# Patient Record
Sex: Female | Born: 1935
Health system: Southern US, Community
[De-identification: ages and names within clinical notes are randomized; demographics above are authoritative.]

## PROBLEM LIST (undated history)

## (undated) DIAGNOSIS — I639 Cerebral infarction, unspecified: Secondary | ICD-10-CM

## (undated) DIAGNOSIS — J449 Chronic obstructive pulmonary disease, unspecified: Secondary | ICD-10-CM

## (undated) DIAGNOSIS — D649 Anemia, unspecified: Secondary | ICD-10-CM

## (undated) DIAGNOSIS — I10 Essential (primary) hypertension: Secondary | ICD-10-CM

## (undated) DIAGNOSIS — R7301 Impaired fasting glucose: Secondary | ICD-10-CM

## (undated) DIAGNOSIS — M1990 Unspecified osteoarthritis, unspecified site: Secondary | ICD-10-CM

## (undated) DIAGNOSIS — H9190 Unspecified hearing loss, unspecified ear: Secondary | ICD-10-CM

## (undated) DIAGNOSIS — M419 Scoliosis, unspecified: Secondary | ICD-10-CM

## (undated) DIAGNOSIS — R002 Palpitations: Secondary | ICD-10-CM

## (undated) DIAGNOSIS — F0390 Unspecified dementia without behavioral disturbance: Secondary | ICD-10-CM

## (undated) DIAGNOSIS — M810 Age-related osteoporosis without current pathological fracture: Secondary | ICD-10-CM

## (undated) DIAGNOSIS — K219 Gastro-esophageal reflux disease without esophagitis: Secondary | ICD-10-CM

## (undated) HISTORY — DX: Anemia, unspecified: D64.9

## (undated) HISTORY — DX: Gastro-esophageal reflux disease without esophagitis: K21.9

## (undated) HISTORY — DX: Unspecified osteoarthritis, unspecified site: M19.90

## (undated) HISTORY — DX: Scoliosis, unspecified: M41.9

## (undated) HISTORY — DX: Unspecified hearing loss, unspecified ear: H91.90

## (undated) HISTORY — DX: Cerebral infarction, unspecified: I63.9

## (undated) HISTORY — PX: HIP SURGERY: SHX245

## (undated) HISTORY — DX: Age-related osteoporosis without current pathological fracture: M81.0

## (undated) HISTORY — DX: Impaired fasting glucose: R73.01

## (undated) HISTORY — DX: Palpitations: R00.2

## (undated) HISTORY — DX: Chronic obstructive pulmonary disease, unspecified: J44.9

---

## 2007-03-21 ENCOUNTER — Other Ambulatory Visit: Payer: Self-pay

## 2007-03-21 ENCOUNTER — Emergency Department: Payer: Self-pay | Admitting: Emergency Medicine

## 2013-08-31 DIAGNOSIS — R7309 Other abnormal glucose: Secondary | ICD-10-CM | POA: Diagnosis not present

## 2013-08-31 DIAGNOSIS — D649 Anemia, unspecified: Secondary | ICD-10-CM | POA: Diagnosis not present

## 2013-08-31 DIAGNOSIS — Z8673 Personal history of transient ischemic attack (TIA), and cerebral infarction without residual deficits: Secondary | ICD-10-CM | POA: Diagnosis not present

## 2013-08-31 DIAGNOSIS — R05 Cough: Secondary | ICD-10-CM | POA: Diagnosis not present

## 2013-08-31 DIAGNOSIS — K219 Gastro-esophageal reflux disease without esophagitis: Secondary | ICD-10-CM | POA: Diagnosis not present

## 2013-08-31 DIAGNOSIS — I10 Essential (primary) hypertension: Secondary | ICD-10-CM | POA: Diagnosis not present

## 2013-08-31 DIAGNOSIS — J438 Other emphysema: Secondary | ICD-10-CM | POA: Diagnosis not present

## 2013-08-31 DIAGNOSIS — F509 Eating disorder, unspecified: Secondary | ICD-10-CM | POA: Diagnosis not present

## 2013-08-31 DIAGNOSIS — R634 Abnormal weight loss: Secondary | ICD-10-CM | POA: Diagnosis not present

## 2013-08-31 DIAGNOSIS — R059 Cough, unspecified: Secondary | ICD-10-CM | POA: Diagnosis not present

## 2013-08-31 DIAGNOSIS — Z1331 Encounter for screening for depression: Secondary | ICD-10-CM | POA: Diagnosis not present

## 2013-08-31 DIAGNOSIS — E781 Pure hyperglyceridemia: Secondary | ICD-10-CM | POA: Diagnosis not present

## 2013-09-28 DIAGNOSIS — D649 Anemia, unspecified: Secondary | ICD-10-CM | POA: Diagnosis not present

## 2013-09-28 DIAGNOSIS — R634 Abnormal weight loss: Secondary | ICD-10-CM | POA: Diagnosis not present

## 2013-09-28 DIAGNOSIS — R630 Anorexia: Secondary | ICD-10-CM | POA: Diagnosis not present

## 2013-09-28 DIAGNOSIS — K921 Melena: Secondary | ICD-10-CM | POA: Diagnosis not present

## 2013-10-05 DIAGNOSIS — I10 Essential (primary) hypertension: Secondary | ICD-10-CM | POA: Diagnosis not present

## 2013-10-05 DIAGNOSIS — M549 Dorsalgia, unspecified: Secondary | ICD-10-CM | POA: Diagnosis not present

## 2013-10-05 DIAGNOSIS — R634 Abnormal weight loss: Secondary | ICD-10-CM | POA: Diagnosis not present

## 2013-10-05 DIAGNOSIS — D649 Anemia, unspecified: Secondary | ICD-10-CM | POA: Diagnosis not present

## 2013-10-05 DIAGNOSIS — G8929 Other chronic pain: Secondary | ICD-10-CM | POA: Diagnosis not present

## 2013-10-05 DIAGNOSIS — R1013 Epigastric pain: Secondary | ICD-10-CM | POA: Diagnosis not present

## 2013-10-05 DIAGNOSIS — R109 Unspecified abdominal pain: Secondary | ICD-10-CM | POA: Diagnosis not present

## 2013-10-05 DIAGNOSIS — K3189 Other diseases of stomach and duodenum: Secondary | ICD-10-CM | POA: Diagnosis not present

## 2013-10-05 DIAGNOSIS — R7309 Other abnormal glucose: Secondary | ICD-10-CM | POA: Diagnosis not present

## 2013-10-05 DIAGNOSIS — J438 Other emphysema: Secondary | ICD-10-CM | POA: Diagnosis not present

## 2013-10-05 DIAGNOSIS — K219 Gastro-esophageal reflux disease without esophagitis: Secondary | ICD-10-CM | POA: Diagnosis not present

## 2013-10-11 DIAGNOSIS — N3289 Other specified disorders of bladder: Secondary | ICD-10-CM | POA: Diagnosis not present

## 2013-10-11 DIAGNOSIS — K828 Other specified diseases of gallbladder: Secondary | ICD-10-CM | POA: Diagnosis not present

## 2013-10-11 DIAGNOSIS — R339 Retention of urine, unspecified: Secondary | ICD-10-CM | POA: Diagnosis not present

## 2013-10-11 DIAGNOSIS — R634 Abnormal weight loss: Secondary | ICD-10-CM | POA: Diagnosis not present

## 2013-10-11 DIAGNOSIS — N1330 Unspecified hydronephrosis: Secondary | ICD-10-CM | POA: Diagnosis not present

## 2013-11-08 DIAGNOSIS — R634 Abnormal weight loss: Secondary | ICD-10-CM | POA: Diagnosis not present

## 2013-11-08 DIAGNOSIS — M549 Dorsalgia, unspecified: Secondary | ICD-10-CM | POA: Diagnosis not present

## 2013-12-04 DIAGNOSIS — I10 Essential (primary) hypertension: Secondary | ICD-10-CM | POA: Diagnosis not present

## 2013-12-04 DIAGNOSIS — D649 Anemia, unspecified: Secondary | ICD-10-CM | POA: Diagnosis not present

## 2013-12-04 DIAGNOSIS — R634 Abnormal weight loss: Secondary | ICD-10-CM | POA: Diagnosis not present

## 2013-12-04 DIAGNOSIS — F0390 Unspecified dementia without behavioral disturbance: Secondary | ICD-10-CM | POA: Diagnosis not present

## 2014-01-02 DIAGNOSIS — R636 Underweight: Secondary | ICD-10-CM | POA: Diagnosis not present

## 2014-01-02 DIAGNOSIS — G8929 Other chronic pain: Secondary | ICD-10-CM | POA: Diagnosis not present

## 2014-01-02 DIAGNOSIS — F0390 Unspecified dementia without behavioral disturbance: Secondary | ICD-10-CM | POA: Diagnosis not present

## 2014-01-02 DIAGNOSIS — I10 Essential (primary) hypertension: Secondary | ICD-10-CM | POA: Diagnosis not present

## 2014-04-03 DIAGNOSIS — M542 Cervicalgia: Secondary | ICD-10-CM | POA: Diagnosis not present

## 2014-04-03 DIAGNOSIS — I10 Essential (primary) hypertension: Secondary | ICD-10-CM | POA: Diagnosis not present

## 2014-04-03 DIAGNOSIS — Z23 Encounter for immunization: Secondary | ICD-10-CM | POA: Diagnosis not present

## 2014-04-03 DIAGNOSIS — F0390 Unspecified dementia without behavioral disturbance: Secondary | ICD-10-CM | POA: Diagnosis not present

## 2014-04-03 DIAGNOSIS — R636 Underweight: Secondary | ICD-10-CM | POA: Diagnosis not present

## 2014-06-12 ENCOUNTER — Observation Stay: Payer: Self-pay | Admitting: Internal Medicine

## 2014-06-12 DIAGNOSIS — I6523 Occlusion and stenosis of bilateral carotid arteries: Secondary | ICD-10-CM | POA: Diagnosis not present

## 2014-06-12 DIAGNOSIS — I739 Peripheral vascular disease, unspecified: Secondary | ICD-10-CM | POA: Diagnosis not present

## 2014-06-12 DIAGNOSIS — F0390 Unspecified dementia without behavioral disturbance: Secondary | ICD-10-CM | POA: Diagnosis not present

## 2014-06-12 DIAGNOSIS — R42 Dizziness and giddiness: Secondary | ICD-10-CM | POA: Diagnosis not present

## 2014-06-12 DIAGNOSIS — F329 Major depressive disorder, single episode, unspecified: Secondary | ICD-10-CM | POA: Diagnosis not present

## 2014-06-12 DIAGNOSIS — J439 Emphysema, unspecified: Secondary | ICD-10-CM | POA: Diagnosis not present

## 2014-06-12 DIAGNOSIS — G459 Transient cerebral ischemic attack, unspecified: Secondary | ICD-10-CM | POA: Diagnosis not present

## 2014-06-12 DIAGNOSIS — I4510 Unspecified right bundle-branch block: Secondary | ICD-10-CM | POA: Diagnosis not present

## 2014-06-12 DIAGNOSIS — E785 Hyperlipidemia, unspecified: Secondary | ICD-10-CM | POA: Diagnosis not present

## 2014-06-12 DIAGNOSIS — I639 Cerebral infarction, unspecified: Secondary | ICD-10-CM | POA: Diagnosis not present

## 2014-06-12 DIAGNOSIS — Z23 Encounter for immunization: Secondary | ICD-10-CM | POA: Diagnosis not present

## 2014-06-12 DIAGNOSIS — Z8249 Family history of ischemic heart disease and other diseases of the circulatory system: Secondary | ICD-10-CM | POA: Diagnosis not present

## 2014-06-12 DIAGNOSIS — I638 Other cerebral infarction: Secondary | ICD-10-CM | POA: Diagnosis not present

## 2014-06-12 DIAGNOSIS — R4182 Altered mental status, unspecified: Secondary | ICD-10-CM | POA: Diagnosis not present

## 2014-06-12 DIAGNOSIS — I10 Essential (primary) hypertension: Secondary | ICD-10-CM | POA: Diagnosis not present

## 2014-06-12 DIAGNOSIS — Z8673 Personal history of transient ischemic attack (TIA), and cerebral infarction without residual deficits: Secondary | ICD-10-CM | POA: Diagnosis not present

## 2014-06-12 DIAGNOSIS — R410 Disorientation, unspecified: Secondary | ICD-10-CM | POA: Diagnosis not present

## 2014-06-12 DIAGNOSIS — I209 Angina pectoris, unspecified: Secondary | ICD-10-CM | POA: Diagnosis not present

## 2014-06-12 DIAGNOSIS — R55 Syncope and collapse: Secondary | ICD-10-CM | POA: Diagnosis not present

## 2014-06-12 DIAGNOSIS — I63231 Cerebral infarction due to unspecified occlusion or stenosis of right carotid arteries: Secondary | ICD-10-CM | POA: Diagnosis not present

## 2014-06-12 DIAGNOSIS — E780 Pure hypercholesterolemia: Secondary | ICD-10-CM | POA: Diagnosis not present

## 2014-06-12 DIAGNOSIS — F0150 Vascular dementia without behavioral disturbance: Secondary | ICD-10-CM | POA: Diagnosis not present

## 2014-06-12 DIAGNOSIS — R479 Unspecified speech disturbances: Secondary | ICD-10-CM | POA: Diagnosis not present

## 2014-06-12 DIAGNOSIS — I63232 Cerebral infarction due to unspecified occlusion or stenosis of left carotid arteries: Secondary | ICD-10-CM | POA: Diagnosis not present

## 2014-06-12 DIAGNOSIS — I509 Heart failure, unspecified: Secondary | ICD-10-CM | POA: Diagnosis not present

## 2014-06-12 DIAGNOSIS — Z79899 Other long term (current) drug therapy: Secondary | ICD-10-CM | POA: Diagnosis not present

## 2014-06-12 LAB — COMPREHENSIVE METABOLIC PANEL
ALT: 15 U/L
Albumin: 3.3 g/dL — ABNORMAL LOW (ref 3.4–5.0)
Alkaline Phosphatase: 122 U/L — ABNORMAL HIGH
Anion Gap: 7 (ref 7–16)
BILIRUBIN TOTAL: 0.3 mg/dL (ref 0.2–1.0)
BUN: 22 mg/dL — ABNORMAL HIGH (ref 7–18)
CO2: 26 mmol/L (ref 21–32)
CREATININE: 1.24 mg/dL (ref 0.60–1.30)
Calcium, Total: 8.3 mg/dL — ABNORMAL LOW (ref 8.5–10.1)
Chloride: 107 mmol/L (ref 98–107)
EGFR (African American): 54 — ABNORMAL LOW
EGFR (Non-African Amer.): 44 — ABNORMAL LOW
GLUCOSE: 126 mg/dL — AB (ref 65–99)
Osmolality: 284 (ref 275–301)
Potassium: 3.8 mmol/L (ref 3.5–5.1)
SGOT(AST): 17 U/L (ref 15–37)
Sodium: 140 mmol/L (ref 136–145)
Total Protein: 6.8 g/dL (ref 6.4–8.2)

## 2014-06-12 LAB — URINALYSIS, COMPLETE
BACTERIA: NONE SEEN
Bilirubin,UR: NEGATIVE
Glucose,UR: NEGATIVE mg/dL (ref 0–75)
Hyaline Cast: 11
Ketone: NEGATIVE
Leukocyte Esterase: NEGATIVE
Nitrite: NEGATIVE
PH: 5 (ref 4.5–8.0)
Protein: NEGATIVE
Specific Gravity: 1.017 (ref 1.003–1.030)
Squamous Epithelial: 2
WBC UR: 1 /HPF (ref 0–5)

## 2014-06-12 LAB — CBC
HCT: 38.7 % (ref 35.0–47.0)
HGB: 12.1 g/dL (ref 12.0–16.0)
MCH: 28 pg (ref 26.0–34.0)
MCHC: 31.2 g/dL — ABNORMAL LOW (ref 32.0–36.0)
MCV: 90 fL (ref 80–100)
PLATELETS: 244 10*3/uL (ref 150–440)
RBC: 4.32 10*6/uL (ref 3.80–5.20)
RDW: 14.5 % (ref 11.5–14.5)
WBC: 6.2 10*3/uL (ref 3.6–11.0)

## 2014-06-12 LAB — APTT: Activated PTT: 25.2 secs (ref 23.6–35.9)

## 2014-06-12 LAB — TROPONIN I

## 2014-06-12 LAB — CK TOTAL AND CKMB (NOT AT ARMC)
CK, TOTAL: 70 U/L (ref 26–192)
CK-MB: 0.9 ng/mL (ref 0.5–3.6)

## 2014-06-12 LAB — PROTIME-INR
INR: 1
Prothrombin Time: 12.8 secs (ref 11.5–14.7)

## 2014-06-13 DIAGNOSIS — F0150 Vascular dementia without behavioral disturbance: Secondary | ICD-10-CM | POA: Diagnosis not present

## 2014-06-13 DIAGNOSIS — R410 Disorientation, unspecified: Secondary | ICD-10-CM | POA: Diagnosis not present

## 2014-06-13 DIAGNOSIS — G459 Transient cerebral ischemic attack, unspecified: Secondary | ICD-10-CM | POA: Diagnosis not present

## 2014-06-13 DIAGNOSIS — I10 Essential (primary) hypertension: Secondary | ICD-10-CM | POA: Diagnosis not present

## 2014-06-13 LAB — LIPID PANEL
Cholesterol: 153 mg/dL (ref 0–200)
HDL Cholesterol: 30 mg/dL — ABNORMAL LOW (ref 40–60)
Ldl Cholesterol, Calc: 83 mg/dL (ref 0–100)
Triglycerides: 198 mg/dL (ref 0–200)
VLDL Cholesterol, Calc: 40 mg/dL (ref 5–40)

## 2014-06-13 LAB — HEMOGLOBIN A1C: Hemoglobin A1C: 5.9 % (ref 4.2–6.3)

## 2014-06-13 LAB — TSH: Thyroid Stimulating Horm: 6.09 u[IU]/mL — ABNORMAL HIGH

## 2014-06-14 DIAGNOSIS — F0390 Unspecified dementia without behavioral disturbance: Secondary | ICD-10-CM | POA: Diagnosis not present

## 2014-06-14 DIAGNOSIS — M6281 Muscle weakness (generalized): Secondary | ICD-10-CM | POA: Diagnosis not present

## 2014-06-14 DIAGNOSIS — I69398 Other sequelae of cerebral infarction: Secondary | ICD-10-CM | POA: Diagnosis not present

## 2014-06-14 DIAGNOSIS — I10 Essential (primary) hypertension: Secondary | ICD-10-CM | POA: Diagnosis not present

## 2014-06-19 DIAGNOSIS — I10 Essential (primary) hypertension: Secondary | ICD-10-CM | POA: Diagnosis not present

## 2014-06-19 DIAGNOSIS — I639 Cerebral infarction, unspecified: Secondary | ICD-10-CM | POA: Diagnosis not present

## 2014-06-19 DIAGNOSIS — R636 Underweight: Secondary | ICD-10-CM | POA: Diagnosis not present

## 2014-06-19 DIAGNOSIS — E039 Hypothyroidism, unspecified: Secondary | ICD-10-CM | POA: Diagnosis not present

## 2014-06-19 DIAGNOSIS — M6281 Muscle weakness (generalized): Secondary | ICD-10-CM | POA: Diagnosis not present

## 2014-06-19 DIAGNOSIS — F0390 Unspecified dementia without behavioral disturbance: Secondary | ICD-10-CM | POA: Diagnosis not present

## 2014-06-19 DIAGNOSIS — I69398 Other sequelae of cerebral infarction: Secondary | ICD-10-CM | POA: Diagnosis not present

## 2014-06-21 DIAGNOSIS — I69398 Other sequelae of cerebral infarction: Secondary | ICD-10-CM | POA: Diagnosis not present

## 2014-06-21 DIAGNOSIS — M6281 Muscle weakness (generalized): Secondary | ICD-10-CM | POA: Diagnosis not present

## 2014-06-21 DIAGNOSIS — F0390 Unspecified dementia without behavioral disturbance: Secondary | ICD-10-CM | POA: Diagnosis not present

## 2014-06-21 DIAGNOSIS — I10 Essential (primary) hypertension: Secondary | ICD-10-CM | POA: Diagnosis not present

## 2014-09-21 DIAGNOSIS — F0390 Unspecified dementia without behavioral disturbance: Secondary | ICD-10-CM | POA: Diagnosis not present

## 2014-09-21 DIAGNOSIS — E039 Hypothyroidism, unspecified: Secondary | ICD-10-CM | POA: Diagnosis not present

## 2014-09-21 DIAGNOSIS — I10 Essential (primary) hypertension: Secondary | ICD-10-CM | POA: Diagnosis not present

## 2014-10-10 NOTE — H&P (Signed)
PATIENT NAME:  Allison Bowman, Allison Bowman MR#:  [id number] DATE OF BIRTH:  Mar 22, 1934  DATE OF ADMISSION:  06/12/2014  REFERRING PHYSICIAN: Bobetta LimeEryka A. Inocencio HomesGayle, MD   PRIMARY CARE PHYSICIAN: Steele SizerMark A. Crissman, MD   ADMISSION DIAGNOSIS: Transient ischemic attack.   HISTORY OF PRESENT ILLNESS: This is a 81 year old Caucasian female who presents to the Emergency Department with symptoms of word finding difficulty, anomia, as well as confusion. She had 2 episodes of these symptoms on the morning of admission that lasted 1-2 minutes, according to her son. The patient denies pain. She was sitting down at the time of these episodes, and thus sustained no falls. There was no loss of consciousness. The patient's vital signs appeared to remain stable during these episodes, nor did she lose continence. There was no strange motor behavior and the patient states that she feels fine. Due to her advanced age and history of cerebrovascular accidents, the Emergency Department called for admission.   REVIEW OF SYSTEMS:  CONSTITUTIONAL: The patient denies fever or weakness.  EYES: Denies blurred vision or inflammation.  EARS, NOSE AND THROAT: Denies tinnitus or sore throat.  RESPIRATORY: Denies cough or shortness of breath.  CARDIOVASCULAR: Denies chest pain or palpitations.  GENITOURINARY: Denies increased frequency, hesitancy, urgency, or dysuria.  GASTROINTESTINAL: Denies nausea, vomiting, diarrhea, or abdominal pain.  HEMATOLOGIC AND LYMPHATIC: Denies easy bruising or bleeding.  INTEGUMENT: Denies rashes or lesions.  MUSCULOSKELETAL: Denies myalgias or arthralgias.  NEUROLOGIC: Denies numbness in her extremities or dysarthria.  PSYCHIATRIC: Denies depression or suicidal ideation.   PAST MEDICAL HISTORY: Dementia, hypertension, and history of cerebrovascular accident.   PAST SURGICAL HISTORY: None.   SOCIAL HISTORY: The patient lives with her son. She is able to do most of her ADLs on her own.   FAMILY HISTORY: The  patient's brother is deceased of a myocardial infarction at a young age.   MEDICATIONS:  1.  Atenolol 25 mg 1 tablet p.o. daily.  2.  Donepezil 5 mg 1 tablet p.o. at bedtime.  3.  Hydrochlorothiazide 25 mg 1/2 tablet p.o. daily.  4.  Lisinopril 5 mg 1 tablet p.o. daily.  5.  Mirtazapine 30 mg 1 tablet p.o. at bedtime.   ALLERGIES: No known drug allergies.   PERTINENT LABORATORY RESULTS AND RADIOGRAPHIC FINDINGS: Serum glucose is 126, BUN 22, creatinine 1.24, sodium is 140, potassium 3.8, chloride 107, bicarbonate 26, calcium is 8.3, serum albumin is 3.3, alkaline phosphatase 122, AST is 17, ALT is 15. Troponin is negative. White blood cell count is 6.2, hemoglobin 12.1, hematocrit is 38.7, platelet count is 244,000. MCV is 90. INR is 1. Urinalysis is negative for infection. Carotid Doppler ultrasounds show moderate plaques at the level of both carotid bulbs and proximal internal carotid arteries, there is an estimated bilateral ICA stenosis less than 50%. CT of the head without contrast shows atrophy with small vessel disease. There are several infarcts of indeterminate age but no hemorrhage or mass effect seen. MRI of the brain without contrast shows probable areas of acute or subacute infarct in the left parietal lobe with the chronic small vessel changes mentioned before. Chest x-ray shows underlying emphysema but no edema or consolidation. There are multiple foci of atherosclerotic calcifications seen as well.   PHYSICAL EXAMINATION:  VITAL SIGNS: Temperature is 98.3, pulse 77, respirations 19, blood pressure 155/72, pulse oximetry 94% on room air.  GENERAL: The patient is alert and oriented x 3 in no apparent distress.  HEENT: Normocephalic, atraumatic. Pupils equal, round, and reactive light  and accommodation. Extraocular movements are intact. Mucous membranes are moist.  NECK: Trachea is midline. No adenopathy. Thyroid is nonpalpable, nontender.  CHEST: Atraumatic, but the patient has a  significant kyphosis.  CARDIOVASCULAR: Regular rate and rhythm. Normal S1, S2. No rubs, clicks, or murmurs appreciated.  LUNGS: Clear to auscultation bilaterally. Normal effort and excursion.  ABDOMEN: Positive bowel sounds. Soft, nontender, nondistended. No hepatosplenomegaly.  GENITOURINARY: Deferred.  MUSCULOSKELETAL: The patient moves all 4 extremities equally. Strength is 5/5 in upper and lower extremities bilaterally. I was unable to walk the patient at the time of exam.  SKIN: Warm and dry. No rashes or lesions.  EXTREMITIES: No clubbing, cyanosis, or edema.  NEUROLOGIC: Cranial nerves II-XII are grossly intact. Sensation is normal and upper and lower extremities bilaterally.  PSYCHIATRIC: Mood is normal. Affect is congruent. The patient has excellent judgment and good insight into her illness. In our conversation, she displays no signs of dementia.   ASSESSMENT AND PLAN: This is a 81 year old female admitted for transient ischemic attack.  1.  Transient ischemic attack: Anomia and confusion lasting only a few minutes that are now resolved. The patient's CT scan shows lesions of indeterminate age. She has a carotid ultrasound and MRI of the brain that do not show any acute abnormalities. I have placed a neurology consult and we will await their recommendations.  2.  Hypertension: We will continue the patient's home regimen.  3.  Dementia: Continue Aricept and mirtazapine.  4.  Deep vein thrombosis prophylaxis: Heparin.  5.  Gastrointestinal prophylaxis: None, as the patient is not critically ill.   CODE STATUS: The patient is a full code.   TIME SPENT ON ADMISSION ORDERS AND PATIENT CARE: Approximately 35 minutes.    ____________________________ Kelton Pillar. Sheryle Hail, MD msd:bm D: 06/14/2014 06:30:02 ET T: 06/14/2014 06:56:59 ET JOB#: [id number]  cc: Kelton Pillar. Sheryle Hail, MD, <Dictator> Kelton Pillar Jalena Vanderlinden MD ELECTRONICALLY SIGNED 06/15/2014 4:33

## 2014-10-10 NOTE — Discharge Summary (Signed)
PATIENT NAME:  Allison Bowman, Allison Bowman MR#:  [id number] DATE OF BIRTH:  [birth year]  DATE OF ADMISSION:  06/12/2014 DATE OF DISCHARGE:  06/13/2014  DISCHARGE DIAGNOSES: 1. Acute/subacute infarct in the left parietal lobe with minimal residual speech disturbances.  2. Abnormal TSH, possible hypothyroidism.   SECONDARY DIAGNOSIS:  Dementia, hypertension, and history of cerebrovascular accident.   CONSULTATIONS: Nephrology, Dr. Pauletta BrownsYuriy Zeylikman.  PROCEDURES AND RADIOLOGY:  1. Chest x-ray on December 29th showed underlying emphysema, multiple foci of atherosclerotic calcification.  2. CT scan of the head without contrast on December 29th showed atrophy with small vessel disease. Several infarcts of uncertain age. No hemorrhage or mass effect.  3. MRI of the brain without contrast on December 29th showed probable area of acute/subacute infarct in the left parietal lobe. Advance atrophy and advanced chronic ischemic changes. Scattered areas of chronic micro hemorrhages in the brain, suggestive of chronic hypertension.  4. Bilateral carotid Dopplers on December 29th showed moderate plaque at the level of both carotid bulbs and proximal internal carotid arteries. Estimated bilateral ICA stenosis less than 50%.  5. Major laboratory panel: UA on admission was negative.   HISTORY OF PRESENT ILLNESS AND SHORT HOSPITAL COURSE: The patient is 81 year old female with the above-mentioned medical problems, who was admitted for symptoms of TIA with word-finding difficulty anomia as well as confusion. Please see Dr. Casimiro NeedleMichael Diamond's dictated history and physical for further details. The patient underwent extensive neurological work-up showing possible acute/subacute stroke. Neurology consultation was obtained with Dr. Pauletta BrownsYuriy Zeylikman, who recommended starting aspirin, along with physical occupational therapy. The patient was set up for home health nurse, speech therapy, and nursing aide, as per family request, along with  the patient's preference. She was feeling much better. Her speech was almost back to her baseline, was discharged home in stable condition.   PHYSICAL EXAMINATION: VITAL SIGNS: On the date of discharge, her vital signs are as follows: Temperature 98.3, heart rate 86 per minute, respirations 18 per minute, blood pressure 106/68. She is saturating 95% on room air.   PERTINENT PHYSICAL EXAMINATION ON THE DATE OF DISCHARGE: CARDIOVASCULAR: S1, S2 normal. No murmurs, rubs, or gallop.  LUNGS: Clear to auscultation bilaterally. No wheezing, rales, rhonchi, or crepitation.  ABDOMEN: Soft, benign.  NEUROLOGICAL: Nonfocal examination. She had a muscle strength 5/5 in all extremities. Sensation is very intact. Coordination was intact. Reflexes were diminished throughout. She was somewhat confused. She was able to tell her name and date of birth. There was no aphasia or dysarthria, and speech seemed to be oral fluent.  All other physical examination remained at baseline.   DISCHARGE MEDICATIONS:  Medication Instructions  lisinopril 5 mg oral tablet  1 tab(s) orally once a day   hydrochlorothiazide 25 mg oral tablet  0.5 tab(s) orally once a day   atenolol 25 mg oral tablet  1 tab(s) orally once a day   mirtazapine 30 mg oral tablet  1 tab(s) orally once a day (at bedtime)   donepezil 5 mg oral tablet  1 tab(s) orally once a day (at bedtime)   aspirin 325 mg oral delayed release tablet  1 tab(s) orally once a day   atorvastatin 20 mg oral tablet  1 tab(s) orally once a day (at bedtime)   synthroid 25 mcg (0.025 mg) oral tablet  1 tab(s) orally once a day     DISCHARGE DIET: :Low fat, low cholesterol, low sodium.   DISCHARGE ACTIVITY: As tolerated.   DISCHARGE INSTRUCTIONS AND FOLLOWUP: The patient was instructed  to follow up with her primary care physician, Dr. Gabriel Cirri, in 1-2 weeks. She will need follow-up with [facility] neurology in 2-4 weeks. She was set up to get home health nurse, nursing aide,  and speech therapy at home, set up by care management.   TOTAL TIME DISCHARGING THIS PATIENT: 45 minutes.    ____________________________ Ellamae Sia. Sherryll Burger, MD vss:mw D: 06/14/2014 15:25:16 ET T: 06/14/2014 16:11:00 ET JOB#: [id number]  cc: Posey Jasmin S. Sherryll Burger, MD, <Dictator> Phillips Odor. Jamesetta Orleans, NP [facility]rology Pauletta Browns, MD Ellamae Sia [facility] MD ELECTRONICALLY SIGNED 06/18/2014 10:16

## 2014-10-10 NOTE — Consult Note (Signed)
PATIENT NAME:  Allison Bowman, Allison Bowman MR#:  [id number] DATE OF BIRTH:  Jan 03, 1934  DATE OF CONSULTATION:  06/12/2014  CONSULTING PHYSICIAN:  Pauletta BrownsYuriy Anthany Thornhill, MD   REASON FOR CONSULTATION: Altered mental status.   HISTORY OF PRESENT ILLNESS: This is a 81 year old female who presented with periods of confusion.  Information obtained from the patient's family at bedside. The patient woke up at 7:00 a.m. and appeared to be confused.  She resides with family members.  She was confused about washing clothes and tried to take a shower without using shampoo which is unusual for her.  Currently, the family states that the patient's mental status improved, but unsure if she is back to baseline. No focal deficits as per family to suggest acute ischemia. Neurology was consulted for altered mental status.  Rule out stroke.   PAST MEDICAL HISTORY:  Reviewed.  MEDICATIONS: The patient's medications have been reviewed.   LABORATORY DATA:  Electrolytes were reviewed with elevated creatinine and BUN suggests questionable dehydration but baseline unknown. CAT scan of the head reviewed. The patient has chronic bilateral infarcts.   PHYSICAL EXAMINATION:  VITAL SIGNS: Include a temperature of 98.3, pulse 77, respirations 19, blood pressure 155/72, pulse oximetry 94% at room air.  NEUROLOGIC:  Speech appears to be fluent. No signs of dysarthria, no signs of aphasia. Extraocular movements are intact. The patient is confused, states she is at home but able to tell me her full name and date of birth. Tongue is midline. Shoulder shrug intact. Motor strength appears to be 5/5, bilateral upper and lower extremities. Sensation intact to light touch and temperature. Coordination intact.  Gait intact. Reflexes diminished throughout.   IMPRESSION: A 81 year old female with confusion that started this morning.  The patient does have old infarcts on her CAT scan of the head.  Agree with hydration.  I think she is probably dehydrated.   Obtain a urinalysis to look for signs of urinary tract infection. The patient is ordered an MRI. We will follow-up.  We discussed this with the family. Please call with any questions.     ____________________________ Pauletta BrownsYuriy Ayris Carano, MD yz:DT D: 06/12/2014 13:13:11 ET T: 06/12/2014 13:57:49 ET JOB#: [id number]  cc: Pauletta BrownsYuriy Mabeline Varas, MD, <Dictator> Pauletta BrownsYURIY Ginelle Bays MD ELECTRONICALLY SIGNED 07/03/2014 21:06

## 2014-11-30 ENCOUNTER — Inpatient Hospital Stay: Payer: Medicare Other

## 2014-11-30 ENCOUNTER — Inpatient Hospital Stay
Admission: EM | Admit: 2014-11-30 | Discharge: 2014-12-03 | DRG: 481 | Disposition: A | Discharge status: Skilled Nursing Facility | Payer: Medicare Other | Attending: Internal Medicine | Admitting: Internal Medicine

## 2014-11-30 ENCOUNTER — Encounter: Payer: Self-pay | Admitting: Emergency Medicine

## 2014-11-30 ENCOUNTER — Emergency Department: Payer: Medicare Other

## 2014-11-30 ENCOUNTER — Inpatient Hospital Stay: Payer: Medicare Other | Admitting: Anesthesiology

## 2014-11-30 ENCOUNTER — Encounter
Admission: EM | Disposition: A | Discharge status: Skilled Nursing Facility | Payer: Self-pay | Source: Home / Self Care | Attending: Internal Medicine

## 2014-11-30 ENCOUNTER — Inpatient Hospital Stay (HOSPITAL_COMMUNITY)
Admit: 2014-11-30 | Discharge: 2014-11-30 | Disposition: A | Discharge status: Home / Self Care | Payer: Medicare Other | Attending: Internal Medicine | Admitting: Internal Medicine

## 2014-11-30 DIAGNOSIS — M25552 Pain in left hip: Secondary | ICD-10-CM

## 2014-11-30 DIAGNOSIS — M79605 Pain in left leg: Secondary | ICD-10-CM

## 2014-11-30 DIAGNOSIS — I10 Essential (primary) hypertension: Secondary | ICD-10-CM | POA: Diagnosis not present

## 2014-11-30 DIAGNOSIS — I959 Hypotension, unspecified: Secondary | ICD-10-CM | POA: Diagnosis present

## 2014-11-30 DIAGNOSIS — R339 Retention of urine, unspecified: Secondary | ICD-10-CM | POA: Diagnosis not present

## 2014-11-30 DIAGNOSIS — K219 Gastro-esophageal reflux disease without esophagitis: Secondary | ICD-10-CM | POA: Diagnosis present

## 2014-11-30 DIAGNOSIS — F0390 Unspecified dementia without behavioral disturbance: Secondary | ICD-10-CM | POA: Diagnosis present

## 2014-11-30 DIAGNOSIS — Z466 Encounter for fitting and adjustment of urinary device: Secondary | ICD-10-CM | POA: Diagnosis not present

## 2014-11-30 DIAGNOSIS — Z9181 History of falling: Secondary | ICD-10-CM | POA: Diagnosis not present

## 2014-11-30 DIAGNOSIS — Z8673 Personal history of transient ischemic attack (TIA), and cerebral infarction without residual deficits: Secondary | ICD-10-CM | POA: Diagnosis not present

## 2014-11-30 DIAGNOSIS — I350 Nonrheumatic aortic (valve) stenosis: Secondary | ICD-10-CM | POA: Diagnosis not present

## 2014-11-30 DIAGNOSIS — Q525 Fusion of labia: Secondary | ICD-10-CM | POA: Diagnosis not present

## 2014-11-30 DIAGNOSIS — S72141A Displaced intertrochanteric fracture of right femur, initial encounter for closed fracture: Secondary | ICD-10-CM | POA: Diagnosis not present

## 2014-11-30 DIAGNOSIS — J449 Chronic obstructive pulmonary disease, unspecified: Secondary | ICD-10-CM

## 2014-11-30 DIAGNOSIS — Q899 Congenital malformation, unspecified: Secondary | ICD-10-CM | POA: Diagnosis not present

## 2014-11-30 DIAGNOSIS — S72142A Displaced intertrochanteric fracture of left femur, initial encounter for closed fracture: Secondary | ICD-10-CM

## 2014-11-30 DIAGNOSIS — M5136 Other intervertebral disc degeneration, lumbar region: Secondary | ICD-10-CM | POA: Diagnosis present

## 2014-11-30 DIAGNOSIS — S72002A Fracture of unspecified part of neck of left femur, initial encounter for closed fracture: Secondary | ICD-10-CM | POA: Diagnosis not present

## 2014-11-30 DIAGNOSIS — S72009A Fracture of unspecified part of neck of unspecified femur, initial encounter for closed fracture: Secondary | ICD-10-CM | POA: Diagnosis present

## 2014-11-30 DIAGNOSIS — R0602 Shortness of breath: Secondary | ICD-10-CM | POA: Diagnosis not present

## 2014-11-30 DIAGNOSIS — Z7401 Bed confinement status: Secondary | ICD-10-CM | POA: Diagnosis not present

## 2014-11-30 DIAGNOSIS — N179 Acute kidney failure, unspecified: Secondary | ICD-10-CM | POA: Diagnosis not present

## 2014-11-30 DIAGNOSIS — W19XXXA Unspecified fall, initial encounter: Secondary | ICD-10-CM | POA: Diagnosis present

## 2014-11-30 DIAGNOSIS — R300 Dysuria: Secondary | ICD-10-CM | POA: Diagnosis present

## 2014-11-30 DIAGNOSIS — D62 Acute posthemorrhagic anemia: Secondary | ICD-10-CM | POA: Diagnosis not present

## 2014-11-30 DIAGNOSIS — R634 Abnormal weight loss: Secondary | ICD-10-CM | POA: Diagnosis present

## 2014-11-30 DIAGNOSIS — I639 Cerebral infarction, unspecified: Secondary | ICD-10-CM

## 2014-11-30 DIAGNOSIS — N289 Disorder of kidney and ureter, unspecified: Secondary | ICD-10-CM | POA: Diagnosis not present

## 2014-11-30 DIAGNOSIS — R011 Cardiac murmur, unspecified: Secondary | ICD-10-CM | POA: Diagnosis not present

## 2014-11-30 DIAGNOSIS — S72142D Displaced intertrochanteric fracture of left femur, subsequent encounter for closed fracture with routine healing: Secondary | ICD-10-CM | POA: Diagnosis not present

## 2014-11-30 DIAGNOSIS — S0990XA Unspecified injury of head, initial encounter: Secondary | ICD-10-CM | POA: Diagnosis not present

## 2014-11-30 DIAGNOSIS — R338 Other retention of urine: Secondary | ICD-10-CM | POA: Diagnosis not present

## 2014-11-30 DIAGNOSIS — S299XXA Unspecified injury of thorax, initial encounter: Secondary | ICD-10-CM | POA: Diagnosis not present

## 2014-11-30 DIAGNOSIS — J9811 Atelectasis: Secondary | ICD-10-CM | POA: Diagnosis not present

## 2014-11-30 HISTORY — DX: Essential (primary) hypertension: I10

## 2014-11-30 HISTORY — DX: Unspecified dementia, unspecified severity, without behavioral disturbance, psychotic disturbance, mood disturbance, and anxiety: F03.90

## 2014-11-30 HISTORY — PX: INTRAMEDULLARY (IM) NAIL INTERTROCHANTERIC: SHX5875

## 2014-11-30 LAB — COMPREHENSIVE METABOLIC PANEL
ALT: 13 U/L — ABNORMAL LOW (ref 14–54)
AST: 24 U/L (ref 15–41)
Albumin: 3.1 g/dL — ABNORMAL LOW (ref 3.5–5.0)
Alkaline Phosphatase: 98 U/L (ref 38–126)
Anion gap: 9 (ref 5–15)
BILIRUBIN TOTAL: 0.7 mg/dL (ref 0.3–1.2)
BUN: 21 mg/dL — ABNORMAL HIGH (ref 6–20)
CHLORIDE: 106 mmol/L (ref 101–111)
CO2: 26 mmol/L (ref 22–32)
Calcium: 9 mg/dL (ref 8.9–10.3)
Creatinine, Ser: 1.42 mg/dL — ABNORMAL HIGH (ref 0.44–1.00)
GFR calc Af Amer: 40 mL/min — ABNORMAL LOW (ref >60)
GFR calc non Af Amer: 34 mL/min — ABNORMAL LOW (ref >60)
Glucose, Bld: 128 mg/dL — ABNORMAL HIGH (ref 65–99)
Potassium: 3.6 mmol/L (ref 3.5–5.1)
SODIUM: 141 mmol/L (ref 135–145)
Total Protein: 6.4 g/dL — ABNORMAL LOW (ref 6.5–8.1)

## 2014-11-30 LAB — URINALYSIS COMPLETE WITH MICROSCOPIC (ARMC ONLY)
BILIRUBIN URINE: NEGATIVE
Bacteria, UA: NONE SEEN
Glucose, UA: NEGATIVE mg/dL
KETONES UR: NEGATIVE mg/dL
NITRITE: NEGATIVE
Protein, ur: NEGATIVE mg/dL
SPECIFIC GRAVITY, URINE: 1.012 (ref 1.005–1.030)
Trans Epithel, UA: <1
pH: 5 (ref 5.0–8.0)

## 2014-11-30 LAB — ABO/RH: ABO/RH(D): O POS

## 2014-11-30 LAB — CBC
HEMATOCRIT: 36.1 % (ref 35.0–47.0)
Hemoglobin: 11.1 g/dL — ABNORMAL LOW (ref 12.0–16.0)
MCH: 27 pg (ref 26.0–34.0)
MCHC: 30.8 g/dL — ABNORMAL LOW (ref 32.0–36.0)
MCV: 87.5 fL (ref 80.0–100.0)
PLATELETS: 302 10*3/uL (ref 150–440)
RBC: 4.12 MIL/uL (ref 3.80–5.20)
RDW: 14.7 % — AB (ref 11.5–14.5)
WBC: 9.3 10*3/uL (ref 3.6–11.0)

## 2014-11-30 LAB — PROTIME-INR
INR: 0.92
Prothrombin Time: 12.6 seconds (ref 11.4–15.0)

## 2014-11-30 LAB — HEMOGLOBIN A1C: Hgb A1c MFr Bld: 5.9 % (ref 4.0–6.0)

## 2014-11-30 LAB — SURGICAL PCR SCREEN
MRSA, PCR: NEGATIVE
Staphylococcus aureus: NEGATIVE

## 2014-11-30 LAB — TSH: TSH: 7.334 u[IU]/mL — ABNORMAL HIGH (ref 0.350–4.500)

## 2014-11-30 LAB — APTT: APTT: 24 s (ref 24–36)

## 2014-11-30 SURGERY — FIXATION, FRACTURE, INTERTROCHANTERIC, WITH INTRAMEDULLARY ROD
Anesthesia: General | Laterality: Left | Wound class: Clean

## 2014-11-30 MED ORDER — SODIUM CHLORIDE 0.9 % IV SOLN
INTRAVENOUS | Status: DC
  Administered 2014-11-30 (×2): via INTRAVENOUS

## 2014-11-30 MED ORDER — MENTHOL 3 MG MT LOZG: 1.0000 | LOZENGE | OROMUCOSAL | Status: DC | PRN

## 2014-11-30 MED ORDER — FENTANYL CITRATE (PF) 100 MCG/2ML IJ SOLN
25.0000 ug | INTRAMUSCULAR | Status: DC | PRN
  Administered 2014-11-30: 25 ug via INTRAVENOUS
  Filled 2014-11-30: qty 0.5

## 2014-11-30 MED ORDER — BUPIVACAINE-EPINEPHRINE (PF) 0.5% -1:200000 IJ SOLN
INTRAMUSCULAR | Status: AC
  Filled 2014-11-30: qty 60

## 2014-11-30 MED ORDER — HYDROCODONE-ACETAMINOPHEN 5-325 MG PO TABS: 1.0000 | ORAL_TABLET | ORAL | Status: DC | PRN

## 2014-11-30 MED ORDER — BUPIVACAINE-EPINEPHRINE (PF) 0.25% -1:200000 IJ SOLN
INTRAMUSCULAR | Status: AC
  Filled 2014-11-30: qty 30

## 2014-11-30 MED ORDER — ZOLPIDEM TARTRATE 5 MG PO TABS: 5.0000 mg | ORAL_TABLET | Freq: Every evening | ORAL | Status: DC | PRN

## 2014-11-30 MED ORDER — CLINDAMYCIN PHOSPHATE 600 MG/50ML IV SOLN
600.0000 mg | Freq: Once | INTRAVENOUS | Status: AC
  Administered 2014-11-30: 600 mg via INTRAVENOUS
  Filled 2014-11-30: qty 50

## 2014-11-30 MED ORDER — MORPHINE SULFATE 2 MG/ML IJ SOLN: 2.0000 mg | INTRAMUSCULAR | Status: DC | PRN

## 2014-11-30 MED ORDER — LISINOPRIL 5 MG PO TABS
5.0000 mg | ORAL_TABLET | Freq: Every day | ORAL | Status: DC
  Administered 2014-12-02 – 2014-12-03 (×2): 5 mg via ORAL
  Filled 2014-11-30 (×2): qty 1

## 2014-11-30 MED ORDER — FERROUS SULFATE 325 (65 FE) MG PO TABS
325.0000 mg | ORAL_TABLET | Freq: Every day | ORAL | Status: DC
  Administered 2014-12-01 – 2014-12-03 (×3): 325 mg via ORAL
  Filled 2014-11-30 (×3): qty 1

## 2014-11-30 MED ORDER — MEMANTINE HCL 10 MG PO TABS
5.0000 mg | ORAL_TABLET | Freq: Two times a day (BID) | ORAL | Status: DC
  Administered 2014-12-01 – 2014-12-03 (×5): 5 mg via ORAL
  Filled 2014-11-30: qty 1
  Filled 2014-11-30: qty 2
  Filled 2014-11-30 (×2): qty 1
  Filled 2014-11-30: qty 2

## 2014-11-30 MED ORDER — ONDANSETRON HCL 4 MG/2ML IJ SOLN
INTRAMUSCULAR | Status: DC | PRN
  Administered 2014-11-30: 4 mg via INTRAVENOUS

## 2014-11-30 MED ORDER — MORPHINE SULFATE 2 MG/ML IJ SOLN
INTRAMUSCULAR | Status: AC
  Administered 2014-11-30: 2 mg via INTRAVENOUS
  Filled 2014-11-30: qty 1

## 2014-11-30 MED ORDER — TRANEXAMIC ACID 1000 MG/10ML IV SOLN
INTRAVENOUS | Status: AC
  Filled 2014-11-30: qty 10

## 2014-11-30 MED ORDER — NEOMYCIN-POLYMYXIN B GU 40-200000 IR SOLN
Status: AC
  Filled 2014-11-30: qty 2

## 2014-11-30 MED ORDER — ACETAMINOPHEN 325 MG PO TABS
650.0000 mg | ORAL_TABLET | Freq: Four times a day (QID) | ORAL | Status: DC | PRN
  Administered 2014-11-30 – 2014-12-02 (×4): 650 mg via ORAL
  Filled 2014-11-30 (×4): qty 2

## 2014-11-30 MED ORDER — MORPHINE SULFATE 2 MG/ML IJ SOLN
2.0000 mg | Freq: Once | INTRAMUSCULAR | Status: AC
  Administered 2014-11-30: 2 mg via INTRAVENOUS

## 2014-11-30 MED ORDER — LIDOCAINE HCL (CARDIAC) 20 MG/ML IV SOLN
INTRAVENOUS | Status: DC | PRN
Start: 2014-11-30 — End: 2014-11-30
  Administered 2014-11-30: 50 mg via INTRAVENOUS

## 2014-11-30 MED ORDER — ONDANSETRON HCL 4 MG PO TABS: 4.0000 mg | ORAL_TABLET | Freq: Four times a day (QID) | ORAL | Status: DC | PRN

## 2014-11-30 MED ORDER — CEFAZOLIN SODIUM-DEXTROSE 2-3 GM-% IV SOLR
2.0000 g | Freq: Four times a day (QID) | INTRAVENOUS | Status: AC
  Administered 2014-12-01 (×3): 2 g via INTRAVENOUS
  Filled 2014-11-30 (×5): qty 50

## 2014-11-30 MED ORDER — MORPHINE SULFATE 2 MG/ML IJ SOLN
0.5000 mg | INTRAMUSCULAR | Status: DC | PRN
  Administered 2014-11-30 (×2): 0.5 mg via INTRAVENOUS
  Filled 2014-11-30 (×2): qty 1

## 2014-11-30 MED ORDER — MIDAZOLAM HCL 5 MG/5ML IJ SOLN
INTRAMUSCULAR | Status: AC
  Filled 2014-11-30: qty 5

## 2014-11-30 MED ORDER — SODIUM CHLORIDE 0.45 % IV SOLN
INTRAVENOUS | Status: DC
  Administered 2014-11-30 – 2014-12-01 (×2): via INTRAVENOUS

## 2014-11-30 MED ORDER — MAGNESIUM HYDROXIDE 400 MG/5ML PO SUSP
30.0000 mL | Freq: Every day | ORAL | Status: DC | PRN
  Administered 2014-12-02: 30 mL via ORAL
  Filled 2014-11-30: qty 30

## 2014-11-30 MED ORDER — ONDANSETRON HCL 4 MG/2ML IJ SOLN
4.0000 mg | Freq: Once | INTRAMUSCULAR | Status: AC
  Administered 2014-11-30: 4 mg via INTRAVENOUS

## 2014-11-30 MED ORDER — ONDANSETRON HCL 4 MG/2ML IJ SOLN: 4.0000 mg | Freq: Once | INTRAMUSCULAR | Status: DC | PRN

## 2014-11-30 MED ORDER — SODIUM CHLORIDE 0.45 % IV BOLUS
500.0000 mL | Freq: Once | INTRAVENOUS | Status: AC
  Administered 2014-11-30: 500 mL via INTRAVENOUS

## 2014-11-30 MED ORDER — MORPHINE SULFATE 2 MG/ML IJ SOLN: 0.5000 mg | INTRAMUSCULAR | Status: DC | PRN

## 2014-11-30 MED ORDER — ALUM & MAG HYDROXIDE-SIMETH 200-200-20 MG/5ML PO SUSP: 30.0000 mL | ORAL | Status: DC | PRN

## 2014-11-30 MED ORDER — ACETAMINOPHEN 325 MG PO TABS: 650.0000 mg | ORAL_TABLET | Freq: Four times a day (QID) | ORAL | Status: DC | PRN

## 2014-11-30 MED ORDER — FENTANYL CITRATE (PF) 100 MCG/2ML IJ SOLN
INTRAMUSCULAR | Status: DC | PRN
  Administered 2014-11-30 (×4): 25 ug via INTRAVENOUS

## 2014-11-30 MED ORDER — ACETAMINOPHEN 650 MG RE SUPP: 650.0000 mg | Freq: Four times a day (QID) | RECTAL | Status: DC | PRN

## 2014-11-30 MED ORDER — CLINDAMYCIN PHOSPHATE 600 MG/50ML IV SOLN
600.0000 mg | Freq: Three times a day (TID) | INTRAVENOUS | Status: AC
  Administered 2014-11-30 – 2014-12-01 (×2): 600 mg via INTRAVENOUS
  Filled 2014-11-30 (×3): qty 50

## 2014-11-30 MED ORDER — METOCLOPRAMIDE HCL 10 MG PO TABS: 5.0000 mg | ORAL_TABLET | Freq: Three times a day (TID) | ORAL | Status: DC | PRN

## 2014-11-30 MED ORDER — ATENOLOL 25 MG PO TABS
25.0000 mg | ORAL_TABLET | Freq: Every day | ORAL | Status: DC
  Administered 2014-12-02 – 2014-12-03 (×2): 25 mg via ORAL
  Filled 2014-11-30 (×3): qty 1

## 2014-11-30 MED ORDER — TRANEXAMIC ACID 1000 MG/10ML IV SOLN
1000.0000 mg | Freq: Once | INTRAVENOUS | Status: DC
  Filled 2014-11-30: qty 10

## 2014-11-30 MED ORDER — MIDAZOLAM HCL 5 MG/5ML IJ SOLN
INTRAMUSCULAR | Status: AC | PRN
  Administered 2014-11-30: 0.5 mg via INTRAVENOUS

## 2014-11-30 MED ORDER — HYDROCHLOROTHIAZIDE 12.5 MG PO CAPS
12.5000 mg | ORAL_CAPSULE | Freq: Every day | ORAL | Status: DC
  Filled 2014-11-30: qty 1

## 2014-11-30 MED ORDER — NEOMYCIN-POLYMYXIN B GU 40-200000 IR SOLN
Status: DC | PRN
  Administered 2014-11-30: 250 mL

## 2014-11-30 MED ORDER — SENNA 8.6 MG PO TABS
1.0000 | ORAL_TABLET | Freq: Two times a day (BID) | ORAL | Status: DC
  Administered 2014-12-01 – 2014-12-03 (×5): 8.6 mg via ORAL
  Filled 2014-11-30 (×5): qty 1

## 2014-11-30 MED ORDER — SODIUM CHLORIDE 0.45 % IV BOLUS
500.0000 mL | Freq: Once | INTRAVENOUS | Status: AC
  Administered 2014-11-30 – 2014-12-01 (×2): 500 mL via INTRAVENOUS

## 2014-11-30 MED ORDER — MIRTAZAPINE 30 MG PO TABS
30.0000 mg | ORAL_TABLET | Freq: Every day | ORAL | Status: DC
  Administered 2014-12-01 – 2014-12-02 (×2): 30 mg via ORAL
  Filled 2014-11-30 (×2): qty 1

## 2014-11-30 MED ORDER — ONDANSETRON HCL 4 MG/2ML IJ SOLN
4.0000 mg | Freq: Four times a day (QID) | INTRAMUSCULAR | Status: DC | PRN
  Administered 2014-12-01: 4 mg via INTRAVENOUS
  Filled 2014-11-30: qty 2

## 2014-11-30 MED ORDER — TRANEXAMIC ACID 1000 MG/10ML IV SOLN
1000.0000 mg | INTRAVENOUS | Status: DC | PRN
  Administered 2014-11-30: 1000 mg via INTRAVENOUS

## 2014-11-30 MED ORDER — ONDANSETRON HCL 4 MG/2ML IJ SOLN
INTRAMUSCULAR | Status: AC
  Administered 2014-11-30: 4 mg via INTRAVENOUS
  Filled 2014-11-30: qty 2

## 2014-11-30 MED ORDER — METOCLOPRAMIDE HCL 5 MG/ML IJ SOLN: 5.0000 mg | Freq: Three times a day (TID) | INTRAMUSCULAR | Status: DC | PRN

## 2014-11-30 MED ORDER — FENTANYL CITRATE (PF) 100 MCG/2ML IJ SOLN
INTRAMUSCULAR | Status: AC
  Filled 2014-11-30: qty 4

## 2014-11-30 MED ORDER — HEPARIN SODIUM (PORCINE) 5000 UNIT/ML IJ SOLN: 5000.0000 [IU] | Freq: Three times a day (TID) | INTRAMUSCULAR | Status: DC

## 2014-11-30 MED ORDER — PROPOFOL 10 MG/ML IV BOLUS
INTRAVENOUS | Status: DC | PRN
  Administered 2014-11-30: 50 mg via INTRAVENOUS

## 2014-11-30 MED ORDER — PHENOL 1.4 % MT LIQD: 1.0000 | OROMUCOSAL | Status: DC | PRN

## 2014-11-30 MED ORDER — FENTANYL CITRATE (PF) 100 MCG/2ML IJ SOLN
INTRAMUSCULAR | Status: AC | PRN
  Administered 2014-11-30: 25 ug via INTRAVENOUS

## 2014-11-30 MED ORDER — FENTANYL CITRATE (PF) 100 MCG/2ML IJ SOLN
INTRAMUSCULAR | Status: AC
  Administered 2014-11-30: 25 ug via INTRAVENOUS
  Filled 2014-11-30: qty 2

## 2014-11-30 MED ORDER — FERROUS SULFATE 325 (65 FE) MG PO TABS: 325.0000 mg | ORAL_TABLET | Freq: Every day | ORAL | Status: DC

## 2014-11-30 MED ORDER — BISACODYL 10 MG RE SUPP
10.0000 mg | Freq: Every day | RECTAL | Status: DC | PRN
  Administered 2014-12-02: 10 mg via RECTAL
  Filled 2014-11-30: qty 1

## 2014-11-30 MED ORDER — BUPIVACAINE-EPINEPHRINE (PF) 0.5% -1:200000 IJ SOLN
INTRAMUSCULAR | Status: DC | PRN
  Administered 2014-11-30: 30 mL

## 2014-11-30 MED ORDER — CEFAZOLIN SODIUM-DEXTROSE 2-3 GM-% IV SOLR
2.0000 g | Freq: Once | INTRAVENOUS | Status: AC
  Administered 2014-11-30: 2 g via INTRAVENOUS
  Filled 2014-11-30: qty 50

## 2014-11-30 MED ORDER — ENOXAPARIN SODIUM 30 MG/0.3ML ~~LOC~~ SOLN
30.0000 mg | SUBCUTANEOUS | Status: DC
  Administered 2014-12-01: 30 mg via SUBCUTANEOUS
  Filled 2014-11-30: qty 0.3

## 2014-11-30 MED ORDER — HYDROCODONE-ACETAMINOPHEN 5-325 MG PO TABS
1.0000 | ORAL_TABLET | Freq: Four times a day (QID) | ORAL | Status: DC | PRN
  Administered 2014-12-01: 1 via ORAL
  Filled 2014-11-30: qty 1

## 2014-11-30 MED ORDER — FLEET ENEMA 7-19 GM/118ML RE ENEM: 1.0000 | ENEMA | Freq: Once | RECTAL | Status: AC | PRN

## 2014-11-30 MED ORDER — IPRATROPIUM-ALBUTEROL 0.5-2.5 (3) MG/3ML IN SOLN: 3.0000 mL | RESPIRATORY_TRACT | Status: DC | PRN

## 2014-11-30 SURGICAL SUPPLY — 34 items
357.399 IMPLANT
456.301 IMPLANT
458.934 IMPLANT
BIT DRILL GRAY 5X250 (MISCELLANEOUS) IMPLANT
BIT DRILL RADIO 4.0 (BIT) ×3 IMPLANT
BLADE HELICAL TI 11.0X80 (Orthopedic Implant) ×3 IMPLANT
CANISTER SUCT 1200ML W/VALVE (MISCELLANEOUS) ×3 IMPLANT
CHLORAPREP W/TINT 26ML (MISCELLANEOUS) ×6 IMPLANT
DRAPE INCISE 23X17 IOBAN STRL (DRAPES) ×2
DRAPE INCISE IOBAN 23X17 STRL (DRAPES) ×1 IMPLANT
DRSG AQUACEL AG ADV 3.5X10 (GAUZE/BANDAGES/DRESSINGS) ×3 IMPLANT
Fixation nail ×3 IMPLANT
GAUZE PETRO XEROFOAM 1X8 (MISCELLANEOUS) IMPLANT
GAUZE SPONGE 4X4 12PLY STRL (GAUZE/BANDAGES/DRESSINGS) ×3 IMPLANT
GLOVE INDICATOR 8.0 STRL GRN (GLOVE) ×6 IMPLANT
GLOVE SURG ORTHO 8.5 STRL (GLOVE) ×6 IMPLANT
GOWN STRL REUS W/ TWL LRG LVL3 (GOWN DISPOSABLE) ×2 IMPLANT
GOWN STRL REUS W/TWL LRG LVL3 (GOWN DISPOSABLE) ×4
GUIDEWIRE 3.2X400 (WIRE) ×3 IMPLANT
KIT RM TURNOVER STRD PROC AR (KITS) ×3 IMPLANT
MAT BLUE FLOOR 46X72 FLO (MISCELLANEOUS) ×3 IMPLANT
NAIL TFN 11/125 170 (Nail) ×3 IMPLANT
NEEDLE SPNL 18GX3.5 QUINCKE PK (NEEDLE) ×6 IMPLANT
NS IRRIG 500ML POUR BTL (IV SOLUTION) ×3 IMPLANT
PACK HIP COMPR (MISCELLANEOUS) ×3 IMPLANT
PAD GROUND ADULT SPLIT (MISCELLANEOUS) ×3 IMPLANT
REAMER ROD DEEP FLUTE 2.5X950 (INSTRUMENTS) IMPLANT
SCREW LOCK TI 5.0X34 F/IM NAIL (Screw) ×3 IMPLANT
STAPLER SKIN PROX 35W (STAPLE) ×3 IMPLANT
SUCTION FRAZIER TIP 10 FR DISP (SUCTIONS) ×3 IMPLANT
SUT VIC AB 0 CT1 36 (SUTURE) ×3 IMPLANT
SUT VIC AB 2-0 CT1 27 (SUTURE) ×2
SUT VIC AB 2-0 CT1 TAPERPNT 27 (SUTURE) ×1 IMPLANT
SYR 30ML LL (SYRINGE) ×6 IMPLANT

## 2014-11-30 NOTE — Op Note (Signed)
DATE OF SURGERY:  11/30/2014  TIME: 4:52 PM  PATIENT NAME:  Allison Bowman  AGE: 81 y.o.  PRE-OPERATIVE DIAGNOSIS:  Comminuted intertrochanteric fractured left hip  POST-OPERATIVE DIAGNOSIS:  SAME  PROCEDURE:  INTRAMEDULLARY (IM) NAIL INTERTROCHANTRIC Left short TFN  SURGEON:  Hermela Hardt E     OPERATIVE IMPLANTS: Synthes trochanteric femoral nail 125/11 mm with interlocking helical blade 80 mm length  and distal locking screw 34 mm length  PREOPERATIVE INDICATIONS:  Allison Bowman is a 81 y.o. year old who fell and suffered a hip fracture. She was brought into the ER and then admitted and optimized and then elected for surgical intervention.    The risks benefits and alternatives were discussed with the patient including but not limited to the risks of nonoperative treatment, versus surgical intervention including infection, bleeding, nerve injury, malunion, nonunion, hardware prominence, hardware failure, need for hardware removal, blood clots, cardiopulmonary complications, morbidity, mortality, among others, and they were willing to proceed.    OPERATIVE PROCEDURE:  The patient was brought to the operating room and placed in the supine position. General anesthesia was administered, with a suprapubic foley. She was placed on the fracture table.  Closed reduction was performed under C-arm guidance. The length of the femur was also measured using fluoroscopy. Time out was then performed after sterile prep and drape. She received preoperative antibiotics.  Incision was made proximal to the greater trochanter. A guidewire was placed in the appropriate position. Confirmation was made on AP and lateral views. The above-named nail was opened. I opened the proximal femur with a reamer. I then placed the nail by hand easily down. I did not need to ream the femur.  Once the nail was completely seated, I placed a guidepin into the femoral head into the center center position through a second  incision.  I measured the length, and then reamed the lateral cortex and up into the head. I then placed the helical blade. Slight compression was applied. Anatomic fixation achieved. Bone quality was mediocre.  I then secured the proximal interlock.  The distal locking screw was then placed and after confirming the position of the fracture fragments and hardware I then removed the instruments, and took final C-arm pictures AP and lateral the entire length of the leg. Anatomic reconstruction was achieved, and the wounds were irrigated copiously and closed with Vicryl  followed by staples and sterile gauze for the skin. The patient was awakened and returned to PACU in stable and satisfactory condition. There no complications and the patient tolerated the procedure well.  She will be partial weightbearing as tolerated, and will be on Lovenox  For DVT prophylaxis.     Allison Bowman, M.D.

## 2014-11-30 NOTE — ED Provider Notes (Signed)
[facility] Emergency Department Provider Note  ____________________________________________  Time seen: 6:10 AM  I have reviewed the triage vital signs and the nursing notes.   HISTORY  Chief Complaint Fall and Hip Pain      HPI Allison Bowman is a 81 y.o. female presents via EMS from home patient stated that she was gardening and was in route to go to the [employer] when she fell injuring her left leg. Patient currently complains of 8 out of 10 left leg pain.She stated that she fell backwards unsure if she lost consciousness or if she struck her head.     Past Medical History  Diagnosis Date  . Dementia   . HTN (hypertension)     There are no active problems to display for this patient.   History reviewed. No pertinent past surgical history.  No current outpatient prescriptions on file.  Allergies Review of patient's allergies indicates no known allergies.  History reviewed. No pertinent family history.  Social History History  Substance Use Topics  . Smoking status: Never Smoker   . Smokeless tobacco: Never Used  . Alcohol Use: No    Review of Systems  Constitutional: Negative for fever. Eyes: Negative for visual changes. ENT: Negative for sore throat. Cardiovascular: Negative for chest pain. Respiratory: Negative for shortness of breath. Gastrointestinal: Negative for abdominal pain, vomiting and diarrhea. Genitourinary: Negative for dysuria. Musculoskeletal: Negative for back pain. Positive for left hip/leg pain Skin: Negative for rash. Neurological: Negative for headaches, focal weakness or numbness.   10-point ROS otherwise negative.  ____________________________________________   PHYSICAL EXAM:  VITAL SIGNS: ED Triage Vitals  Enc Vitals Group     BP 11/30/14 0614 136/60 mmHg     Pulse Rate 11/30/14 0614 102     Resp 11/30/14 0614 20     Temp 11/30/14 0614 97.8 F (36.6 C)     Temp Source 11/30/14 0614  Oral     SpO2 11/30/14 0614 94 %     Weight 11/30/14 0614 89 lb 15.2 oz (40.801 kg)     Height 11/30/14 0614 5' (1.524 m)     Head Cir --      Peak Flow --      Pain Score --      Pain Loc --      Pain Edu? --      Excl. in GC? --      Constitutional: Alert . Apparent distress Eyes: Conjunctivae are normal. PERRL. Normal extraocular movements. ENT   Head: Normocephalic and atraumatic.   Nose: No congestion/rhinnorhea.   Mouth/Throat: Mucous membranes are moist.   Neck: No stridor. Hematological/Lymphatic/Immunilogical: No cervical lymphadenopathy. Cardiovascular: Normal rate, regular rhythm. Normal and symmetric distal pulses are present in all extremities. No murmurs, rubs, or gallops. Respiratory: Normal respiratory effort without tachypnea nor retractions. Breath sounds are clear and equal bilaterally. No wheezes/rales/rhonchi. Gastrointestinal: Soft and nontender. No distention. There is no CVA tenderness. Genitourinary: deferred Musculoskeletal: Tenderness to the left hip/thigh with palpation. Left leg shortening with external rotation  Neurologic:  Normal speech and language. No gross focal neurologic deficits are appreciated. Speech is normal.  Skin:  Skin is warm, dry and intact. No rash noted. Psychiatric: Mood and affect are normal. Speech and behavior are normal. Patient exhibits appropriate insight and judgment.  ____________________________________________    LABS (pertinent positives/negatives)  Labs Reviewed  CBC - Abnormal; Notable for the following:    Hemoglobin 11.1 (*)    MCHC 30.8 (*)  RDW 14.7 (*)    All other components within normal limits  COMPREHENSIVE METABOLIC PANEL - Abnormal; Notable for the following:    Glucose, Bld 128 (*)    BUN 21 (*)    Creatinine, Ser 1.42 (*)    Total Protein 6.4 (*)    Albumin 3.1 (*)    ALT 13 (*)    GFR calc non Af Amer 34 (*)    GFR calc Af Amer 40 (*)    All other components within normal  limits  TSH - Abnormal; Notable for the following:    TSH 7.334 (*)    All other components within normal limits  VITAMIN D 25 HYDROXY - Abnormal; Notable for the following:    Vit D, 25-Hydroxy 17.8 (*)    All other components within normal limits  URINALYSIS COMPLETEWITH MICROSCOPIC ([facility] ONLY) - Abnormal; Notable for the following:    Color, Urine YELLOW (*)    APPearance CLEAR (*)    Hgb urine dipstick 2+ (*)    Leukocytes, UA 1+ (*)    Squamous Epithelial / LPF 0-5 (*)    All other components within normal limits  BASIC METABOLIC PANEL - Abnormal; Notable for the following:    Sodium 133 (*)    Potassium 3.2 (*)    Glucose, Bld 109 (*)    BUN 22 (*)    Creatinine, Ser 1.13 (*)    Calcium 6.7 (*)    GFR calc non Af Amer 45 (*)    GFR calc Af Amer 53 (*)    All other components within normal limits  HEMOGLOBIN - Abnormal; Notable for the following:    Hemoglobin 8.0 (*)    All other components within normal limits  CBC - Abnormal; Notable for the following:    RBC 3.74 (*)    Hemoglobin 10.0 (*)    HCT 31.2 (*)    RDW 15.7 (*)    All other components within normal limits  CBC - Abnormal; Notable for the following:    RBC 3.77 (*)    Hemoglobin 9.8 (*)    HCT 31.6 (*)    MCHC 31.0 (*)    RDW 16.4 (*)    All other components within normal limits  BASIC METABOLIC PANEL - Abnormal; Notable for the following:    Glucose, Bld 115 (*)    Calcium 7.3 (*)    GFR calc non Af Amer 55 (*)    All other components within normal limits  CBC - Abnormal; Notable for the following:    RBC 3.56 (*)    Hemoglobin 9.9 (*)    HCT 29.4 (*)    RDW 16.6 (*)    All other components within normal limits  SURGICAL PCR SCREEN  PROTIME-INR  APTT  HEMOGLOBIN A1C  TSH  TYPE AND SCREEN  ABO/RH  PREPARE RBC (CROSSMATCH)     ____________________________________________   EKG   Date: 11/30/2014  Rate: 99  Rhythm: normal sinus rhythm  QRS Axis: normal  Intervals: normal  ST/T  Wave abnormalities: normal  Conduction Disutrbances: none  Narrative Interpretation: unremarkable      ____________________________________________    RADIOLOGY   ____________________________________________    INITIAL IMPRESSION / ASSESSMENT AND PLAN / ED COURSE  Pertinent labs & imaging results that were available during my care of the patient were reviewed by me and considered in my medical decision making (see chart for details).    ____________________________________________   FINAL CLINICAL IMPRESSION(S) / ED DIAGNOSES  Final diagnoses:  Deformity  Left leg pain  Left hip pain      Darci Current, MD 12/03/14 2204

## 2014-11-30 NOTE — Progress Notes (Signed)
Contacted  Dr Clent Ridges b/p low at 70/35. Ordered to run iv    500 cc bolus x 1

## 2014-11-30 NOTE — Progress Notes (Signed)
Initial Nutrition Assessment  INTERVENTION: Medical Food Supplement Therapy: will recommend once diet order able to be advanced    NUTRITION DIAGNOSIS:  Inadequate oral intake related to inability to eat as evidenced by NPO status.  GOAL:   (Diet advancement as medically able)   MONITOR:   (Energy Intake, Anthropometrics)  REASON FOR ASSESSMENT:  Consult Assessment of nutrition requirement/status  ASSESSMENT:  Pt admitted with left hip fracture, scheduled for ORIF today. Noted pt also with urinary retention currently. PMHx: Past Medical History  Diagnosis Date  . Dementia   . HTN (hypertension)    Current Nutrition: pt NPO currently  Food/Nutrition-Related History: unable to clarify past po intake with pt as pt out of room this am and unavailable this afternoon. No MST on file at this time.   Gastrointestinal Profile:   Medications: NS at 99mL/hr Labs:  Recent Labs Lab 11/30/14 0629  NA 141  K 3.6  CL 106  CO2 26  BUN 21*  CREATININE 1.42*  CALCIUM 9.0  GLUCOSE 128*   Nutritional Anemia Profile:  CBC Latest Ref Rng 11/30/2014 06/12/2014  WBC 3.6 - 11.0 K/uL 9.3 6.2  Hemoglobin 12.0 - 16.0 g/dL 11.1(L) 12.1  Hematocrit 35.0 - 47.0 % 36.1 38.7  Platelets 150 - 440 K/uL 302 244   Anthropometrics:   Height:  Ht Readings from Last 1 Encounters:  11/30/14 5' (1.524 m)    Weight:  Wt Readings from Last 1 Encounters:  11/30/14 89 lb 15.2 oz (40.801 kg)    Wt Readings from Last 10 Encounters:  11/30/14 89 lb 15.2 oz (40.801 kg)    BMI:  Body mass index is 17.57 kg/(m^2).  Estimated Nutritional Needs:  Kcal:  BEE: 806kcals, TEE: (IF 1.1-1.3)(AF 1.2) [id number]  Protein:  41-49g protein (1.0-1.2g/kg)  Fluid:  1013-1227mL of fluid (25-80mL/kg)  Diet Order:  Diet NPO time specified  EDUCATION NEEDS:  Education needs no appropriate at this time   Intake/Output Summary (Last 24 hours) at 11/30/14 1409 Last data filed at 11/30/14  0800  Gross per 24 hour  Intake      0 ml  Output      0 ml  Net      0 ml    Last BM:  unknown  HIGH Care Level  Leda Quail, RD, LDN Pager [phone]

## 2014-11-30 NOTE — Progress Notes (Signed)
I was unable to successfully catherize the patient.  The supervisor did come to assist and was unsuccessful. Dr Apolinar Junes from urology was unsuccessful.  Pt is currently in specials getting a suprapubic cath placed

## 2014-11-30 NOTE — Anesthesia Postprocedure Evaluation (Signed)
  Anesthesia Post-op Note  Patient: Allison Bowman  Procedure(s) Performed: Procedure(s): INTRAMEDULLARY (IM) NAIL INTERTROCHANTRIC Left short  (Left)  Anesthesia type:General  Patient location: PACU  Post pain: Pain level controlled  Post assessment: Post-op Vital signs reviewed, Patient's Cardiovascular Status Stable, Respiratory Function Stable, Patent Airway and No signs of Nausea or vomiting  Post vital signs: Reviewed and stable  Last Vitals:  Filed Vitals:   11/30/14 1801  BP: 115/49  Pulse: 85  Temp: 36.5 C  Resp: 18    Level of consciousness: awake, alert  and patient cooperative  Complications: No apparent anesthesia complications

## 2014-11-30 NOTE — Consult Note (Signed)
ORTHOPAEDIC CONSULTATION  REQUESTING PHYSICIAN: Katharina Caper, MD  Chief Complaint: Left hip pain  HPI: Allison Bowman is a 81 y.o. female who complains of  Left hip pain due to a fall this AM.  She was going out the door and tripped with her son nearby.  Brought to ER where exam and x-rays show a comminuted left intertrochanteric hip fx.  Treatment options discussed with patient and son.  Surgery recommended for rehab and nursing care.  They agree with this treatment plan.  Risks and benefits discussed as well.    Past Medical History  Diagnosis Date  . Dementia   . HTN (hypertension)    History reviewed. No pertinent past surgical history. History   Social History  . Marital Status: Single    Spouse Name: N/A  . Number of Children: N/A  . Years of Education: N/A   Social History Main Topics  . Smoking status: Never Smoker   . Smokeless tobacco: Never Used  . Alcohol Use: No  . Drug Use: No  . Sexual Activity: Not on file   Other Topics Concern  . None   Social History Narrative  . None   History reviewed. No pertinent family history. Allergies  Allergen Reactions  . Lovastatin Shortness Of Breath   Prior to Admission medications   Not on File   Dg Chest 1 View  11/30/2014   CLINICAL DATA:  Fall onto left side. Left hip pain. Left hip fracture.  EXAM: CHEST  1 VIEW  COMPARISON:  06/12/2014  FINDINGS: Atherosclerotic and tortuous thoracic aorta. Borderline enlargement of the cardiopericardial silhouette. Bilateral interstitial accentuation, some of which is chronic but some of which is likely acute.  Biapical pleural parenchymal scarring. Left rib deformities likely from old fractures. Bony demineralization.  IMPRESSION: 1. Borderline cardiomegaly with suspected pulmonary venous hypertension. There is also some chronic interstitial accentuation. 2. Biapical pleural parenchymal scarring. 3. Bony demineralization.   Electronically Signed   By: Gaylyn Rong M.D.    On: 11/30/2014 07:52   Ct Head Wo Contrast  11/30/2014   CLINICAL DATA:  Fall while gardening, injuring the left leg.  EXAM: CT HEAD WITHOUT CONTRAST  TECHNIQUE: Contiguous axial images were obtained from the base of the skull through the vertex without intravenous contrast.  COMPARISON:  06/12/2014  FINDINGS: Skull and Sinuses:Negative for fracture or destructive process. The mastoids, middle ears, and imaged paranasal sinuses are clear.  Orbits: Cataract resections.  No traumatic findings  Brain: No evidence of acute infarction, hemorrhage, hydrocephalus, or mass lesion/mass effect.  Remote infarcts of the upper brain stem, left upper cerebellum, left thalamus, bilateral caudate/putamen, and left parietal lobe cortex and subcortical white matter. There is also generalized cerebral volume loss, mildly advanced for age.  Extensive intracranial atherosclerotic calcification, with notable calcification along the proximal anterior cerebral arteries.  IMPRESSION: 1. No evidence of intracranial injury. 2. Advanced chronic ischemic injury from small and medium vessel disease.   Electronically Signed   By: Marnee Spring M.D.   On: 11/30/2014 07:30   Dg Hip Unilat With Pelvis 2-3 Views Left  11/30/2014   CLINICAL DATA:  Fall onto left hip.  Left hip pain.  EXAM: LEFT HIP (WITH PELVIS) 2-3 VIEWS  COMPARISON:  None.  FINDINGS: Left hip intertrochanteric fracture observed with varus angulation and separate lesser trochanteric fragment.  Vascular calcification. Lower lumbar degenerative disc disease with prominent loss of intervertebral disc height.  Left pubic ramus deformities are present but are probably old.  IMPRESSION: 1. Acute left hip intertrochanteric fracture with varus angulation and separate lesser trochanteric fragment. 2. Deformity of the left pubic rami. On the prior lumbar spine radiographs from 07/02/2011, the upper ramus deformity was present previously and accordingly these pubic rami fractures are  probably old. 3. Lower lumbar spondylosis and degenerative disc disease. 4. Vascular calcifications.   Electronically Signed   By: Gaylyn Rong M.D.   On: 11/30/2014 07:54    Positive ROS: All other systems have been reviewed and were otherwise negative with the exception of those mentioned in the HPI and as above.  Physical Exam: General: Alert, no acute distress Cardiovascular: No pedal edema Respiratory: No cyanosis, no use of accessory musculature GI: No organomegaly, abdomen is soft and non-tender Skin: No lesions in the area of chief complaint Neurologic: Sensation intact distally Psychiatric: Patient is competent for consent with normal mood and affect Lymphatic: No axillary or cervical lymphadenopathy  MUSCULOSKELETAL: Alert and lying quietly.  Left leg short and rotated.  csm  good distally.  Skin intact.  Pain with rom.  No other injury noted.  Assessment: Left hip fx.  Plan: ORIF left hip with TFN later today.  Cleared by medical service.      Valinda Hoar, MD [phone]   11/30/2014 10:35 AM

## 2014-11-30 NOTE — Clinical Social Work Note (Signed)
Clinical Social Work Assessment  Patient Details  Name: Allison Bowman MRN: [id number] Date of Birth: 05/30/1934  Date of referral:  11/30/14               Reason for consult:  Facility Placement                Permission sought to share information with:    Permission granted to share information::     Name::        Agency::     Relationship::     Contact Information:     Housing/Transportation Living arrangements for the past 2 months:   (home) Source of Information:  Adult Children Patient Interpreter Needed:  None Criminal Activity/Legal Involvement Pertinent to Current Situation/Hospitalization:  No - Comment as needed Significant Relationships:  Adult Children Lives with:    Do you feel safe going back to the place where you live?    Need for family participation in patient care:  Yes (Comment)  Care giving concerns:  Lives with son   Office manager / plan:  CSW attempted to see patient this morning. Patient was not very talkative but patient's visitor stated that patient's son would be by in a little while. CSW returned when patient's son arrived and CSW discussed discharge planning. Patient's son is aware that more than likely she will require rehab at discharge. Patient's son states she has not been to rehab before. Patient's son states that he stays with her 24/7 because the doctor has told him that she cannot be left alone. He states that patient is normally ambulatory but that she has been having more and more trouble with ambulation and her gait is unsteady. Bedsearch initiated.   Employment status:  Retired Health and safety inspector:  Medicare PT Recommendations:  Not assessed at this time Information / Referral to community resources:  Skilled Nursing Facility  Patient/Family's Response to care:  Patient's son is in agreement with rehab plan.  Patient/Family's Understanding of and Emotional Response to Diagnosis, Current Treatment, and Prognosis:  Patient's  son informed CSW that he was afraid she would eventually fall as she has been looking like "an accident waiting to happen" at home.   Emotional Assessment Appearance:  Appears stated age Attitude/Demeanor/Rapport:   (pleasant and quiet) Affect (typically observed):    Orientation:    Alcohol / Substance use:  Not Applicable Psych involvement (Current and /or in the community):  No (Comment)  Discharge Needs  Concerns to be addressed:  Basic Needs Readmission within the last 30 days:  No Current discharge risk:  None Barriers to Discharge:  No Barriers Identified   York Spaniel, LCSW 11/30/2014, 1:06 PM

## 2014-11-30 NOTE — Anesthesia Procedure Notes (Signed)
Procedure Name: LMA Insertion Date/Time: 11/30/2014 3:46 PM Performed by: Sherron Flemings Pre-anesthesia Checklist: Patient identified, Patient being monitored, Timeout performed, Emergency Drugs available and Suction available Patient Re-evaluated:Patient Re-evaluated prior to inductionOxygen Delivery Method: Circle system utilized Preoxygenation: Pre-oxygenation with 100% oxygen Intubation Type: IV induction Ventilation: Mask ventilation without difficulty LMA: LMA inserted LMA Size: 3.0 Tube type: Oral Number of attempts: 1 Placement Confirmation: positive ETCO2 and breath sounds checked- equal and bilateral Tube secured with: Tape Dental Injury: Teeth and Oropharynx as per pre-operative assessment

## 2014-11-30 NOTE — ED Notes (Signed)
Patient transported to X-ray 

## 2014-11-30 NOTE — ED Notes (Signed)
MD at bedside. 

## 2014-11-30 NOTE — Progress Notes (Signed)
ANTICOAGULATION CONSULT NOTE - Initial Consult  Pharmacy Consult for Lovenox Indication: VTE prophylaxis  Allergies  Allergen Reactions  . Lovastatin Shortness Of Breath    Patient Measurements: Height: 5' (152.4 cm) Weight: 89 lb 15.2 oz (40.801 kg) IBW/kg (Calculated) : 45.5 Heparin Dosing Weight:   Vital Signs: Temp: 97.7 F (36.5 C) (06/17 1801) Temp Source: Oral (06/17 1801) BP: 115/49 mmHg (06/17 1801) Pulse Rate: 85 (06/17 1801)  Labs:  Recent Labs  11/30/14 0629  HGB 11.1*  HCT 36.1  PLT 302  APTT 24  LABPROT 12.6  INR 0.92  CREATININE 1.42*    Estimated Creatinine Clearance: 21 mL/min (by C-G formula based on Cr of 1.42).   Medical History: Past Medical History  Diagnosis Date  . Dementia   . HTN (hypertension)     Medications:  Prescriptions prior to admission  Medication Sig Dispense Refill Last Dose  . aspirin 325 MG tablet Take 325 mg by mouth daily.     Marland Kitchen atenolol (TENORMIN) 25 MG tablet Take 25 mg by mouth daily.     . ferrous sulfate 325 (65 FE) MG tablet Take 325 mg by mouth daily with breakfast.     . hydrochlorothiazide (MICROZIDE) 12.5 MG capsule Take 12.5 mg by mouth daily.     Marland Kitchen lisinopril (PRINIVIL,ZESTRIL) 5 MG tablet Take 5 mg by mouth daily.     . memantine (NAMENDA) 5 MG tablet Take 5 mg by mouth 2 (two) times daily.     . mirtazapine (REMERON) 30 MG tablet Take 30 mg by mouth at bedtime.       Assessment: CrCl = 21 ml/min  Goal of Therapy:  DVT prophylaxis Monitor platelets by anticoagulation protocol: Yes   Plan:  Lovenox 40 mg SQ Q24H originally ordered.  Will adjust dose to lovenox 30 mg SQ Q24H based on CrCl < 30 ml/min.   Rosa Wyly D 11/30/2014,6:26 PM

## 2014-11-30 NOTE — Consult Note (Signed)
Urology Consult Follow Up  Subjective: Allison Bowman is a 81 y/o white female who has been admitted for left hip fracture.  She is currently expected to undergo hip replacement this afternoon, but she was found to have a PVR of 800 cc.  The floor could not place a foley catheter and we are asked to place a foley.  The patient is very hard of hearing and a poor historian.  She does states she voids large amounts on her own and wears pads.  On exam, her labia minora are completely fused.  A scant amount of urine is seen oozing from an area to the left of her clitoris.  I suspect that she has been urinating into her vaginal vault and then it just drains out at a later time.  Her son is at her bedside and states she has been having problems with leakage for some time.    Anti-infective's: Anti-infectives    Start     Dose/Rate Route Frequency Ordered Stop   11/30/14 1300  ceFAZolin (ANCEF) IVPB 2 g/50 mL premix     2 g 100 mL/hr over 30 Minutes Intravenous  Once 11/30/14 0856     11/30/14 1300  clindamycin (CLEOCIN) IVPB 600 mg     600 mg 100 mL/hr over 30 Minutes Intravenous  Once 11/30/14 0856        Current Facility-Administered Medications  Medication Dose Route Frequency Provider Last Rate Last Dose  . 0.9 %  sodium chloride infusion   Intravenous Continuous Katharina Caper, MD 50 mL/hr at 11/30/14 0953    . acetaminophen (TYLENOL) tablet 650 mg  650 mg Oral Q6H PRN Katharina Caper, MD       Or  . acetaminophen (TYLENOL) suppository 650 mg  650 mg Rectal Q6H PRN Katharina Caper, MD      . ceFAZolin (ANCEF) IVPB 2 g/50 mL premix  2 g Intravenous Once Deeann Saint, MD      . clindamycin (CLEOCIN) IVPB 600 mg  600 mg Intravenous Once Deeann Saint, MD      . heparin injection 5,000 Units  5,000 Units Subcutaneous 3 times per day Katharina Caper, MD   5,000 Units at 11/30/14 1119  . HYDROcodone-acetaminophen (NORCO/VICODIN) 5-325 MG per tablet 1-2 tablet  1-2 tablet Oral Q4H PRN Katharina Caper, MD       . ipratropium-albuterol (DUONEB) 0.5-2.5 (3) MG/3ML nebulizer solution 3 mL  3 mL Nebulization Q4H PRN Katharina Caper, MD      . morphine 2 MG/ML injection 0.5 mg  0.5 mg Intravenous Q2H PRN Deeann Saint, MD   0.5 mg at 11/30/14 1308  . tranexamic acid (CYKLOKAPRON) 1,000 mg in sodium chloride 0.9 % 100 mL IVPB  1,000 mg Intravenous Once Deeann Saint, MD       Past Medical History  Diagnosis Date  . Dementia   . HTN (hypertension)    PAST SURGICAL HISTORY: None.  SOCIAL HISTORY:  History  Substance Use Topics  . Smoking status: Never Smoker   . Smokeless tobacco: Never Used  . Alcohol Use: No    FAMILY HISTORY: Patient's brother is deceased of myocardial infarction at young age  DRUG ALLERGIES:  Allergies  Allergen Reactions  . Lovastatin Shortness Of Breath        ROS: Unable to obtain ROS due to mental status, hearing problems.    Objective: Vital signs in last 24 hours: Temp:  [97.8 F (36.6 C)-97.9 F (36.6 C)] 97.9 F (36.6 C) (06/17 0909) Pulse  Rate:  [96-102] 100 (06/17 0909) Resp:  [18-20] 18 (06/17 0909) BP: (112-136)/(57-73) 128/57 mmHg (06/17 0909) SpO2:  [93 %-96 %] 93 % (06/17 0909) Weight:  [40.801 kg (89 lb 15.2 oz)] 40.801 kg (89 lb 15.2 oz) (06/17 0614)  Intake/Output from previous day:   Intake/Output this shift:     Physical Exam  HENT:  Head: Normocephalic.  Neck: Normal range of motion. Neck supple. No tracheal deviation present.  Neurological: She is alert.  Oriented but not able to answer all questions appropriately.     General: Patient with poor dentition, with appropriate affect.   Hard of hearing. Heart: S1 and S2 with murmurs, rubs or gallops Lungs: Clear to auscultation bilaterally Abd: Soft, non-tender, non-distended, bladder not palpable GU:  Labia minora are completely fused.  A scant amount of urine is seen leaking from a crease near the clitoris.  Perineum is clean and dry.  Introitus is completely  covered.   Skin:  Warm and dry.  Lab Results:   Recent Labs  11/30/14 0629  WBC 9.3  HGB 11.1*  HCT 36.1  PLT 302   BMET  Recent Labs  11/30/14 0629  NA 141  K 3.6  CL 106  CO2 26  GLUCOSE 128*  BUN 21*  CREATININE 1.42*  CALCIUM 9.0   PT/INR  Recent Labs  11/30/14 0629  LABPROT 12.6  INR 0.92   ABG No results for input(s): PHART, HCO3 in the last 72 hours.  Invalid input(s): PCO2, PO2  Studies/Results: Dg Chest 1 View  11/30/2014   CLINICAL DATA:  Fall onto left side. Left hip pain. Left hip fracture.  EXAM: CHEST  1 VIEW  COMPARISON:  06/12/2014  FINDINGS: Atherosclerotic and tortuous thoracic aorta. Borderline enlargement of the cardiopericardial silhouette. Bilateral interstitial accentuation, some of which is chronic but some of which is likely acute.  Biapical pleural parenchymal scarring. Left rib deformities likely from old fractures. Bony demineralization.  IMPRESSION: 1. Borderline cardiomegaly with suspected pulmonary venous hypertension. There is also some chronic interstitial accentuation. 2. Biapical pleural parenchymal scarring. 3. Bony demineralization.   Electronically Signed   By: Gaylyn Rong M.D.   On: 11/30/2014 07:52   Ct Head Wo Contrast  11/30/2014   CLINICAL DATA:  Fall while gardening, injuring the left leg.  EXAM: CT HEAD WITHOUT CONTRAST  TECHNIQUE: Contiguous axial images were obtained from the base of the skull through the vertex without intravenous contrast.  COMPARISON:  06/12/2014  FINDINGS: Skull and Sinuses:Negative for fracture or destructive process. The mastoids, middle ears, and imaged paranasal sinuses are clear.  Orbits: Cataract resections.  No traumatic findings  Brain: No evidence of acute infarction, hemorrhage, hydrocephalus, or mass lesion/mass effect.  Remote infarcts of the upper brain stem, left upper cerebellum, left thalamus, bilateral caudate/putamen, and left parietal lobe cortex and subcortical white matter.  There is also generalized cerebral volume loss, mildly advanced for age.  Extensive intracranial atherosclerotic calcification, with notable calcification along the proximal anterior cerebral arteries.  IMPRESSION: 1. No evidence of intracranial injury. 2. Advanced chronic ischemic injury from small and medium vessel disease.   Electronically Signed   By: Marnee Spring M.D.   On: 11/30/2014 07:30   Dg Hip Unilat With Pelvis 2-3 Views Left  11/30/2014   CLINICAL DATA:  Fall onto left hip.  Left hip pain.  EXAM: LEFT HIP (WITH PELVIS) 2-3 VIEWS  COMPARISON:  None.  FINDINGS: Left hip intertrochanteric fracture observed with varus angulation and separate lesser  trochanteric fragment.  Vascular calcification. Lower lumbar degenerative disc disease with prominent loss of intervertebral disc height.  Left pubic ramus deformities are present but are probably old.  IMPRESSION: 1. Acute left hip intertrochanteric fracture with varus angulation and separate lesser trochanteric fragment. 2. Deformity of the left pubic rami. On the prior lumbar spine radiographs from 07/02/2011, the upper ramus deformity was present previously and accordingly these pubic rami fractures are probably old. 3. Lower lumbar spondylosis and degenerative disc disease. 4. Vascular calcifications.   Electronically Signed   By: Gaylyn Rong M.D.   On: 11/30/2014 07:54     Assessment:  1. Failed foley catheter placement-  We were asked to place foley catheter for the patient after it was discovered she had 800 cc in her bladder and the nursing staff could not place a foley.  I attempted to place a 11F straight foley blindly into the crease where I witnessed the urine seeping out without success.  Dr. Apolinar Junes then attempted to place a 11F coude catheter in the same area and that was not successful.  She then tried a glide wire to trace a path into the crease and was unsuccessful.  An attempt to breakdown the adhesions with the tip of the  glide wire also proved unsuccessful in finding a tract into her urethra.  Her bladder was not palpable and it was felt that it would put the patient at risk to attempt a bed side SPT placement.  IR has been contacted and they have graciously agreed to place the SPT under CT guidance.    2. Labial fusion-  Patient was found to have complete labial fusion.  We have recommend clobetasol cream and estrogen cream to be applied to the vaginal area in hopes of breaking down the adhesions.  She will need to follow up with urology in one month's time.   3. Acute renal failure- Cr elevated likely 2/2 retention. 4. Urinary Retention-  Likely multifactorial related to immobilization, medication, and pain.  Plan: Patient with complete labia fusion and therefore cannot place a foley catheter.  She is scheduled to have a SPT in IR by CT guidance.  Urology recommends applying clobetasol cream 0.05% to the fused labia twice daily for two weeks and vaginal estrogen cream applying 0.5 mg pea sized amount)  to the vagina with a finger-tip every night for two weeks and then Monday, Wednesday and Friday nights.  It should be attempted to spread the fused labia apart after applying the creams.    She will need to follow up with urology in one month for SPT change.    CC:     LOS: 0 days    [facility] 11/30/2014   I have seen and examined the patient, attempted Foley placement labs/ imaging reviewed and discussed his management with Michiel Cowboy.  Case as also discussed with IR.  I reviewed the PA's note and agree with the documented findings and plan of care.  She should be discharged home with SPT in place and discharge home with vaginal estrogen cream as well as clobetasol with instructions for manual lysis of adhesions daily.  This plan was discussed in detail with the patient and her son today.  Will sign off, please page with any questions or concerns.    Vanna Scotland, MD

## 2014-11-30 NOTE — Progress Notes (Signed)
Pt is unable to void.  She has tried twice.  Nursing unable to cath the pt.  Dr Winona Legato is aware. urology consult ordered. i am waiting for the urologist to inform me which one will be doing the consult

## 2014-11-30 NOTE — Progress Notes (Signed)
*  PRELIMINARY RESULTS* Echocardiogram 2D Echocardiogram has been performed.  Georgann Housekeeper Hege 11/30/2014, 10:34 AM

## 2014-11-30 NOTE — Transfer of Care (Signed)
Immediate Anesthesia Transfer of Care Note  Patient: Allison Bowman  Procedure(s) Performed: Procedure(s): INTRAMEDULLARY (IM) NAIL INTERTROCHANTRIC Left short  (Left)  Patient Location: PACU  Anesthesia Type:General  Level of Consciousness: Alert, Awake, Oriented  Airway & Oxygen Therapy: Patient Spontanous Breathing  Post-op Assessment: Report given to RN  Post vital signs: Reviewed and stable  Last Vitals:  Filed Vitals:   11/30/14 1658  BP: 110/77  Pulse: 81  Temp: 36.1 C  Resp: 13    Complications: No apparent anesthesia complications

## 2014-11-30 NOTE — Anesthesia Preprocedure Evaluation (Addendum)
Anesthesia Evaluation  Patient identified by MRN, date of birth, ID band Patient confused    Reviewed: Allergy & Precautions, NPO status , Patient's Chart, lab work & pertinent test results  History of Anesthesia Complications Negative for: history of anesthetic complications  Airway Mallampati: III  TM Distance: >3 FB Neck ROM: Full    Dental  (+) Edentulous Upper, Edentulous Lower   Pulmonary COPD COPD inhaler,    Pulmonary exam normal       Cardiovascular hypertension, Pt. on medications and Pt. on home beta blockers + Valvular Problems/Murmurs AS Rhythm:Regular Rate:Normal  Moderate Aortic Stenosis.Marland Kitchenejection fraction 55-60%   Neuro/Psych PSYCHIATRIC DISORDERS dementia CVA, No Residual Symptoms    GI/Hepatic Neg liver ROS, GERD-  Controlled,  Endo/Other  negative endocrine ROS  Renal/GU Renal InsufficiencyRenal disease  negative genitourinary   Musculoskeletal Fx   Abdominal   Peds  Hematology negative hematology ROS (+)   Anesthesia Other Findings   Reproductive/Obstetrics                          Anesthesia Physical Anesthesia Plan  ASA: III  Anesthesia Plan: General   Post-op Pain Management:    Induction: Intravenous  Airway Management Planned: Oral ETT  Additional Equipment:   Intra-op Plan:   Post-operative Plan: Extubation in OR  Informed Consent: I have reviewed the patients History and Physical, chart, labs and discussed the procedure including the risks, benefits and alternatives for the proposed anesthesia with the patient or authorized representative who has indicated his/her understanding and acceptance.     Plan Discussed with: CRNA and Surgeon  Anesthesia Plan Comments:        Anesthesia Quick Evaluation

## 2014-11-30 NOTE — ED Provider Notes (Signed)
-----------------------------------------   7:52 AM on 11/30/2014 -----------------------------------------  Patient care is him from Dr. Manson Passey. Patient has unfortunate suffered a left intertrochanteric fracture. Labs are largely within normal limits, EKG within normal limits, other imaging is negative. We will discuss with orthopedics, and admit to the hospitalists. I discussed with the patient and her family who are agreeable to plan.  Minna Antis, MD 11/30/14 [phone]

## 2014-11-30 NOTE — ED Notes (Signed)
Pt to rm 18 via EMS from home.  EMS reports pt fell while gardening and injured left leg with shortening and rotation.  Pt c/o pain to left hip but unable to rate.  Pt hx of HTN and dementia.  Pt NAD upon arrival.

## 2014-11-30 NOTE — Progress Notes (Signed)
Added abd pad

## 2014-11-30 NOTE — Progress Notes (Signed)
Spoke to dave hower md on phone order for 500 cc bolus .45% NS b/p 73/39

## 2014-11-30 NOTE — H&P (Signed)
[facility] [place] [facility]   PATIENT NAME: Allison Bowman    MR#:  [id number]  DATE OF BIRTH:  [birth year]  DATE OF ADMISSION:  11/30/2014  PRIMARY CARE PHYSICIAN: No primary care provider on file.   REQUESTING/REFERRING PHYSICIAN:   CHIEF COMPLAINT:   Chief Complaint  Patient presents with  . Fall  . Hip Pain    HISTORY OF PRESENT ILLNESS: Allison Bowman  is a 81 y.o. female with a known history of multiple medical problems including hypertension, gastroesophageal reflux disease without esophagitis, COPD as well as stroke who presents to the hospital after fall. According to patient's family, patient's ability to walk around is impaired, she frequently stumbles around and is not able to get around as she used to, but refuses to use a cane. Today she fell down backwards. She was not able to get up, her son tried to get her up . However, she was not able to put any weight on the left hip. She was brought to emergency room where she was noted to have left hip intratrochanteric fracture. Hospitalist services were contacted for admission.   PAST MEDICAL HISTORY:   Past Medical History  Diagnosis Date  . Dementia   . HTN (hypertension)     PAST SURGICAL HISTORY: None.  SOCIAL HISTORY:  History  Substance Use Topics  . Smoking status: Never Smoker   . Smokeless tobacco: Never Used  . Alcohol Use: No    FAMILY HISTORY: Patient's brother is deceased of myocardial infarction at young age  DRUG ALLERGIES:  Allergies  Allergen Reactions  . Lovastatin Shortness Of Breath    Review of Systems  Constitutional: Positive for weight loss. Negative for fever, chills and malaise/fatigue.  HENT: Negative for congestion.   Eyes: Negative for blurred vision and double vision.  Respiratory: Negative for cough, sputum production, shortness of breath and wheezing.   Cardiovascular: Positive for palpitations. Negative for chest pain, orthopnea, leg swelling  and PND.  Gastrointestinal: Negative for nausea, vomiting, abdominal pain, diarrhea, constipation, blood in stool and melena.  Genitourinary: Positive for dysuria, urgency and frequency. Negative for hematuria.  Musculoskeletal: Positive for joint pain and falls.  Skin: Negative for rash.  Neurological: Negative for dizziness and weakness.  Psychiatric/Behavioral: Negative for depression and memory loss. The patient is not nervous/anxious.     MEDICATIONS AT HOME:  Prior to Admission medications   Not on File      PHYSICAL EXAMINATION:   VITAL SIGNS: Blood pressure 115/64, pulse 101, temperature 97.8 F (36.6 C), temperature source Oral, resp. rate 20, height 5' (1.524 m), weight 40.801 kg (89 lb 15.2 oz), SpO2 94 %.  GENERAL:  81 y.o.-year-old patient lying in the bed with no acute distress.  EYES: Pupils equal, round, reactive to light and accommodation. No scleral icterus. Extraocular muscles intact.  HEENT: Head atraumatic, normocephalic. Oropharynx and nasopharynx clear.  NECK:  Supple, no jugular venous distention. No thyroid enlargement, no tenderness.  LUNGS: Normal breath sounds bilaterally, no wheezing, rales,rhonchi or crepitation. No use of accessory muscles of respiration.  CARDIOVASCULAR: S1, S2 normal. She and has 2 systolic murmur. S1 and aortic auscultation side radiating to the left carotid another one in precordium, Marshall auscultation sided, radiating to left axilla. Both murmurs about 4 out of 6 , no rubs, or gallops auscultated.  ABDOMEN: Soft, nontender, nondistended. Bowel sounds present. No organomegaly or mass.  EXTREMITIES: No pedal edema, cyanosis, or clubbing.  NEUROLOGIC: Cranial nerves II through XII  are intact. Muscle strength 5/5 in 3 extremities, but left lower extremity which is externally rotated and shortened. Sensation intact. Gait not checked.  PSYCHIATRIC: The patient is alert and oriented x 3, but slow to respond and demented. She looks at her  son's friend to answer some questions.  SKIN: No obvious rash, lesion, or ulcer.   LABORATORY PANEL:   CBC  Recent Labs Lab 11/30/14 0629  WBC 9.3  HGB 11.1*  HCT 36.1  PLT 302  MCV 87.5  MCH 27.0  MCHC 30.8*  RDW 14.7*   ------------------------------------------------------------------------------------------------------------------  Chemistries   Recent Labs Lab 11/30/14 0629  NA 141  K 3.6  CL 106  CO2 26  GLUCOSE 128*  BUN 21*  CREATININE 1.42*  CALCIUM 9.0  AST 24  ALT 13*  ALKPHOS 98  BILITOT 0.7   ------------------------------------------------------------------------------------------------------------------  Cardiac Enzymes No results for input(s): TROPONINI in the last 168 hours. ------------------------------------------------------------------------------------------------------------------  RADIOLOGY: Dg Chest 1 View  11/30/2014   CLINICAL DATA:  Fall onto left side. Left hip pain. Left hip fracture.  EXAM: CHEST  1 VIEW  COMPARISON:  06/12/2014  FINDINGS: Atherosclerotic and tortuous thoracic aorta. Borderline enlargement of the cardiopericardial silhouette. Bilateral interstitial accentuation, some of which is chronic but some of which is likely acute.  Biapical pleural parenchymal scarring. Left rib deformities likely from old fractures. Bony demineralization.  IMPRESSION: 1. Borderline cardiomegaly with suspected pulmonary venous hypertension. There is also some chronic interstitial accentuation. 2. Biapical pleural parenchymal scarring. 3. Bony demineralization.   Electronically Signed   By: Gaylyn Rong M.D.   On: 11/30/2014 07:52   Ct Head Wo Contrast  11/30/2014   CLINICAL DATA:  Fall while gardening, injuring the left leg.  EXAM: CT HEAD WITHOUT CONTRAST  TECHNIQUE: Contiguous axial images were obtained from the base of the skull through the vertex without intravenous contrast.  COMPARISON:  06/12/2014  FINDINGS: Skull and  Sinuses:Negative for fracture or destructive process. The mastoids, middle ears, and imaged paranasal sinuses are clear.  Orbits: Cataract resections.  No traumatic findings  Brain: No evidence of acute infarction, hemorrhage, hydrocephalus, or mass lesion/mass effect.  Remote infarcts of the upper brain stem, left upper cerebellum, left thalamus, bilateral caudate/putamen, and left parietal lobe cortex and subcortical white matter. There is also generalized cerebral volume loss, mildly advanced for age.  Extensive intracranial atherosclerotic calcification, with notable calcification along the proximal anterior cerebral arteries.  IMPRESSION: 1. No evidence of intracranial injury. 2. Advanced chronic ischemic injury from small and medium vessel disease.   Electronically Signed   By: Marnee Spring M.D.   On: 11/30/2014 07:30   Dg Hip Unilat With Pelvis 2-3 Views Left  11/30/2014   CLINICAL DATA:  Fall onto left hip.  Left hip pain.  EXAM: LEFT HIP (WITH PELVIS) 2-3 VIEWS  COMPARISON:  None.  FINDINGS: Left hip intertrochanteric fracture observed with varus angulation and separate lesser trochanteric fragment.  Vascular calcification. Lower lumbar degenerative disc disease with prominent loss of intervertebral disc height.  Left pubic ramus deformities are present but are probably old.  IMPRESSION: 1. Acute left hip intertrochanteric fracture with varus angulation and separate lesser trochanteric fragment. 2. Deformity of the left pubic rami. On the prior lumbar spine radiographs from 07/02/2011, the upper ramus deformity was present previously and accordingly these pubic rami fractures are probably old. 3. Lower lumbar spondylosis and degenerative disc disease. 4. Vascular calcifications.   Electronically Signed   By: Annitta Needs.D.  On: 11/30/2014 07:54    EKG: Orders placed or performed in visit on 06/12/14  . EKG 12-Lead  EKG in the emergency room revealed normal sinus rhythm at 99 bpm,  normal axis, right bundle branch block. No acute ST -T changes , no EKG to compare with   IMPRESSION AND PLAN:  Principal Problem:   Closed left hip fracture Active Problems:   Hip fracture   HTN (hypertension)   Weight loss   Gastroesophageal reflux disease without esophagitis   Stroke   COPD (chronic obstructive pulmonary disease) 1. Closed left hip intratrochanteric fracture. Admit patient medical floor. Continue pain medications orthopedist surgeon will be consulted for procedure physical therapy will be initiated after surgery. Risks as well as benefits were discussed with patient's family who is in understanding and willing to proceed  2. Renal insufficiency. Continue patient on IV fluids. Follow patient's creatinine and urinalysis  3. Dysuria. Suspected UTI, get UA, as well as cultures and initiate antibiotics if urinalysis is revealing pyuria  4. History of essential hypertension . Patient is not hypertensive at present. We'll follow patient's blood pressure readings very closely  5. Cardiac murmurs. Get echocardiogram  6. COPD continue oxygenas well as inhalation therapy as needed .     All the records are reviewed and case discussed with ED provider. Management plans discussed with the patient, family and they are in agreement.  CODE STATUS:    TOTAL TIME TAKING CARE OF THIS PATIENT: 55 minutes.    Katharina Caper M.D on 11/30/2014 at 8:53 AM  Between 7am to 6pm - Pager - [phone] After 6pm go to [email or web address] - password [email or web address] [facility]  [place] [facility]  Office  [phone]  CC: Primary care physician; No primary care provider on file.

## 2014-11-30 NOTE — Progress Notes (Signed)
Call from [facility] to advise patient was finished with procedure.  OR is ready for patient, patient transferred directly to OR from [facility].

## 2014-11-30 NOTE — Procedures (Signed)
CT suprapubic catheter placement No complication No blood loss. See complete dictation in [facility].

## 2014-12-01 ENCOUNTER — Inpatient Hospital Stay: Payer: Medicare Other

## 2014-12-01 LAB — BASIC METABOLIC PANEL
Anion gap: 5 (ref 5–15)
BUN: 22 mg/dL — ABNORMAL HIGH (ref 6–20)
CALCIUM: 6.7 mg/dL — AB (ref 8.9–10.3)
CO2: 24 mmol/L (ref 22–32)
CREATININE: 1.13 mg/dL — AB (ref 0.44–1.00)
Chloride: 104 mmol/L (ref 101–111)
GFR, EST AFRICAN AMERICAN: 53 mL/min — AB (ref >60)
GFR, EST NON AFRICAN AMERICAN: 45 mL/min — AB (ref >60)
Glucose, Bld: 109 mg/dL — ABNORMAL HIGH (ref 65–99)
Potassium: 3.2 mmol/L — ABNORMAL LOW (ref 3.5–5.1)
SODIUM: 133 mmol/L — AB (ref 135–145)

## 2014-12-01 LAB — CBC
HCT: 31.2 % — ABNORMAL LOW (ref 35.0–47.0)
Hemoglobin: 10 g/dL — ABNORMAL LOW (ref 12.0–16.0)
MCH: 26.8 pg (ref 26.0–34.0)
MCHC: 32 g/dL (ref 32.0–36.0)
MCV: 83.6 fL (ref 80.0–100.0)
PLATELETS: 250 10*3/uL (ref 150–440)
RBC: 3.74 MIL/uL — ABNORMAL LOW (ref 3.80–5.20)
RDW: 15.7 % — AB (ref 11.5–14.5)
WBC: 8.9 10*3/uL (ref 3.6–11.0)

## 2014-12-01 LAB — VITAMIN D 25 HYDROXY (VIT D DEFICIENCY, FRACTURES): VIT D 25 HYDROXY: 17.8 ng/mL — AB (ref 30.0–100.0)

## 2014-12-01 LAB — TSH: TSH: 2.505 u[IU]/mL (ref 0.350–4.500)

## 2014-12-01 LAB — PREPARE RBC (CROSSMATCH)

## 2014-12-01 LAB — HEMOGLOBIN: Hemoglobin: 8 g/dL — ABNORMAL LOW (ref 12.0–16.0)

## 2014-12-01 MED ORDER — ESTROGENS, CONJUGATED 0.625 MG/GM VA CREA
1.0000 | TOPICAL_CREAM | Freq: Every day | VAGINAL | Status: DC
  Administered 2014-12-01 – 2014-12-02 (×2): 1 via VAGINAL
  Filled 2014-12-01: qty 30

## 2014-12-01 MED ORDER — CLOBETASOL PROPIONATE 0.05 % EX CREA
TOPICAL_CREAM | Freq: Two times a day (BID) | CUTANEOUS | Status: DC
  Administered 2014-12-01 – 2014-12-03 (×5): via TOPICAL
  Filled 2014-12-01: qty 15

## 2014-12-01 MED ORDER — ENSURE ENLIVE PO LIQD
237.0000 mL | Freq: Two times a day (BID) | ORAL | Status: DC
  Administered 2014-12-01 – 2014-12-03 (×5): 237 mL via ORAL

## 2014-12-01 MED ORDER — SODIUM CHLORIDE 0.45 % IV BOLUS
500.0000 mL | Freq: Once | INTRAVENOUS | Status: AC
  Administered 2014-12-01: 500 mL via INTRAVENOUS

## 2014-12-01 MED ORDER — SODIUM CHLORIDE 0.9 % IV SOLN
Freq: Once | INTRAVENOUS | Status: AC
  Administered 2014-12-01: 12:00:00 via INTRAVENOUS

## 2014-12-01 MED ORDER — POTASSIUM CHLORIDE CRYS ER 10 MEQ PO TBCR
10.0000 meq | EXTENDED_RELEASE_TABLET | Freq: Two times a day (BID) | ORAL | Status: DC
  Administered 2014-12-01 (×2): 10 meq via ORAL
  Filled 2014-12-01 (×2): qty 1

## 2014-12-01 MED ORDER — SODIUM CHLORIDE 0.45 % IV BOLUS
500.0000 mL | Freq: Once | INTRAVENOUS | Status: AC
Start: 2014-12-01 — End: 2014-12-01
  Administered 2014-12-01: 500 mL via INTRAVENOUS

## 2014-12-01 NOTE — Progress Notes (Signed)
Physical Therapy Treatment Patient Details Name: Allison Bowman MRN: [id number] DOB: 1934-04-19 Today's Date: 12/01/2014    History of Present Illness This patient is a 81 year old female who came to [facility] after a fall suffering a left hip fracture.     PT Comments    Pt is able to improve WBing tolerance/ambulation, but is still hesitant and unsteady with ambulation.  She shows good effort and appears to have less pain with exercises, but still needs AAROM with most acts.   Follow Up Recommendations  SNF     Equipment Recommendations       Recommendations for Other Services       Precautions / Restrictions Restrictions Weight Bearing Restrictions: Yes LLE Weight Bearing: Partial weight bearing    Mobility  Bed Mobility Overal bed mobility: Needs Assistance Bed Mobility: Sit to Supine       Sit to supine: Mod assist      Transfers Overall transfer level: Needs assistance Equipment used: Rolling walker (2 wheeled)   Sit to Stand: Min assist         General transfer comment: Pt does better this session needing less cuing and appears much more comfortable with L LE WBing  Ambulation/Gait Ambulation/Gait assistance: Mod assist Ambulation Distance (Feet): 5 Feet Assistive device: Rolling walker (2 wheeled)       General Gait Details: Pt with less hesitancy with L WBing and is able to use UEs better on walker and overall shows good effort   Stairs            Wheelchair Mobility    Modified Rankin (Stroke Patients Only)       Balance                                    Cognition Arousal/Alertness: Awake/alert Behavior During Therapy: WFL for tasks assessed/performed (exept for confusion) Overall Cognitive Status: History of cognitive impairments - at baseline Area of Impairment:  (Oriented to person, place month. )                    Exercises General Exercises - Lower Extremity Ankle Circles/Pumps: AROM;10 reps Quad  Sets: Strengthening;10 reps Heel Slides: AAROM;10 reps Hip ABduction/ADduction: AAROM;10 reps    General Comments        Pertinent Vitals/Pain Pain Assessment:  (unrated, appears to only have moderate pain with exercises)    Home Living Family/patient expects to be discharged to:: Skilled nursing facility                    Prior Function Level of Independence:  (Patient reports she had been dressing herself but is confused)          PT Goals (current goals can now be found in the care plan section) Acute Rehab PT Goals Patient Stated Goal: Unable to state Progress towards PT goals: Progressing toward goals    Frequency  BID    PT Plan Current plan remains appropriate    Co-evaluation             End of Session Equipment Utilized During Treatment: Gait belt Activity Tolerance: Patient limited by fatigue;Patient limited by pain Patient left: with bed alarm set     Time: 9147-8295 PT Time Calculation (min) (ACUTE ONLY): 24 min  Charges:  $Gait Training: 8-22 mins $Therapeutic Exercise: 8-22 mins  G Codes:     Allison Bowman, PT, DPT [phone]  Allison Bowman 12/01/2014, 5:44 PM

## 2014-12-01 NOTE — Progress Notes (Signed)
Patient post-op day 1-s/p left hip IM nailing. Up with PT x 2 , set up in chair for several hrs, given pain medication as needed - with relief, iv site intact right outer forearm, suprapubic cath inplace- for structure deficit- draining clear yellow urine.  Tolerating regular diet well, maintain hip protocol. Given 1 unit of PRBC for hgb- 8.0/ post transfusion hgb-10. Tolerated transfusion well.  Blood pressure medications held this am due to low blood pressure- MD aware.

## 2014-12-01 NOTE — Progress Notes (Signed)
Physical Therapy Treatment Patient Details Name: Allison Bowman MRN: [id number] DOB: 1934/04/10 Today's Date: 12/01/2014    History of Present Illness Pt with fall, hip fx, ORIF     PT Comments    Pt struggles following instructions appropriately and generally shows limited strength, mobility and struggles with ambulate from bed to recliner.   Follow Up Recommendations  SNF     Equipment Recommendations       Recommendations for Other Services       Precautions / Restrictions Restrictions Weight Bearing Restrictions: Yes LLE Weight Bearing: Partial weight bearing    Mobility  Bed Mobility Overal bed mobility: Needs Assistance Bed Mobility: Supine to Sit     Supine to sit: Mod assist        Transfers Overall transfer level: Needs assistance Equipment used: Rolling walker (2 wheeled) Transfers: Sit to/from Stand Sit to Stand: Mod assist            Ambulation/Gait Ambulation/Gait assistance: Max assist;Mod assist Ambulation Distance (Feet): 5 Feet Assistive device: Rolling walker (2 wheeled)           Stairs            Wheelchair Mobility    Modified Rankin (Stroke Patients Only)       Balance                                    Cognition Arousal/Alertness: Awake/alert   Overall Cognitive Status: Impaired/Different from baseline                      Exercises General Exercises - Lower Extremity Ankle Circles/Pumps: 10 reps;AROM Quad Sets: AROM;10 reps Heel Slides: AAROM;10 reps Hip ABduction/ADduction: AAROM;10 reps    General Comments        Pertinent Vitals/Pain Pain Assessment:  (cannot rate, seems relatively mild)    Home Living Family/patient expects to be discharged to:: Skilled nursing facility                    Prior Function Level of Independence:  (Pt unable to answer appropriately)          PT Goals (current goals can now be found in the care plan section) Acute Rehab PT  Goals Patient Stated Goal: Unable to state PT Goal Formulation: With patient Time For Goal Achievement: 12/15/14 Potential to Achieve Goals: Fair    Frequency  BID    PT Plan      Co-evaluation             End of Session Equipment Utilized During Treatment: Gait belt         Time: 0810-0839 PT Time Calculation (min) (ACUTE ONLY): 29 min  Charges:  $Therapeutic Exercise: 8-22 mins                    G Codes:     Loran Senters, PT, DPT [phone]  Malachi Pro 12/01/2014, 10:22 AM

## 2014-12-01 NOTE — Progress Notes (Deleted)
OT Cancellation Note  Patient Details Name: Allison Bowman MRN: [id number] DOB: [birth year]   Cancelled Treatment:     Attempted Occupational Therapy evaluation X 2. First time patient getting a bath second time she was eating. Will re-attempt time permitting.  Ocie Cornfield Ocie Cornfield, MS/OTR/L  12/01/2014, 12:11 PM

## 2014-12-01 NOTE — Progress Notes (Signed)
[facility] [place] [facility]   PATIENT NAME: Allison Bowman    MR#:  [id number]  DATE OF BIRTH:  1933-12-18  SUBJECTIVE:  CHIEF COMPLAINT:   Chief Complaint  Patient presents with  . Fall  . Hip Pain   Patient presented to the hospital after fall. She was not able to get up and not able to put weight to the left leg. She was brought to emergency room and was noted to have left hip intratrochanteric fracture. She was admitted to the hospital.  After admission she was noted to have urinary retention. Urology consultation was requested, since nurses were not able to place Foley catheter.  However urologist was also not able to place catheter due to labial adhesions, so patient was taken to operating room where interventional radiologist placed suprapubic catheter 11/30/2014. Then patient was taken to operating room by the orthopedist, Dr. Deeann Saint placed trochanteric femoral nail  to left hip fracture. Today patient is somewhat confused, however, denies any significant pain. She was able to walk some with physical therapy. Physical therapist recommends rehabilitation placement.   Review of Systems  Unable to perform ROS: other   some confusion  VITAL SIGNS: Blood pressure 107/52, pulse 85, temperature 98.2 F (36.8 C), temperature source Oral, resp. rate 20, height 5' (1.524 m), weight 40.801 kg (89 lb 15.2 oz), SpO2 97 %.  PHYSICAL EXAMINATION:   GENERAL:  81 y.o.-year-old patient lying in the bed with no acute distress.  EYES: Pupils equal, round, reactive to light and accommodation. No scleral icterus. Extraocular muscles intact.  HEENT: Head atraumatic, normocephalic. Oropharynx and nasopharynx clear.  NECK:  Supple, no jugular venous distention. No thyroid enlargement, no tenderness.  LUNGS: Diminished breath sounds bilaterally, diffuse, intermittent rales, rhonchi and crepitations, intermittent scattered wheezing. No use of accessory muscles of  respiration. Patient is tachypneic.  CARDIOVASCULAR: S1, S2 normal. No murmurs, rubs, or gallops.  ABDOMEN: Soft, nontender, nondistended. Bowel sounds present. No organomegaly or mass.  EXTREMITIES: No pedal edema, cyanosis, or clubbing.  NEUROLOGIC: Cranial nerves II through XII are intact. Muscle strength 5/5 in all extremities. Sensation intact. Gait not checked.  PSYCHIATRIC: The patient is alert and oriented x 3.  SKIN: No obvious rash, lesion, or ulcer.   ORDERS/RESULTS REVIEWED:   CBC  Recent Labs Lab 11/30/14 0629 12/01/14 0315  WBC 9.3  --   HGB 11.1* 8.0*  HCT 36.1  --   PLT 302  --   MCV 87.5  --   MCH 27.0  --   MCHC 30.8*  --   RDW 14.7*  --    ------------------------------------------------------------------------------------------------------------------  Chemistries   Recent Labs Lab 11/30/14 0629 12/01/14 0315  NA 141 133*  K 3.6 3.2*  CL 106 104  CO2 26 24  GLUCOSE 128* 109*  BUN 21* 22*  CREATININE 1.42* 1.13*  CALCIUM 9.0 6.7*  AST 24  --   ALT 13*  --   ALKPHOS 98  --   BILITOT 0.7  --    ------------------------------------------------------------------------------------------------------------------ estimated creatinine clearance is 26.4 mL/min (by C-G formula based on Cr of 1.13). ------------------------------------------------------------------------------------------------------------------  Recent Labs  12/01/14 0315  TSH 2.505    Cardiac Enzymes No results for input(s): CKMB, TROPONINI, MYOGLOBIN in the last 168 hours.  Invalid input(s): CK ------------------------------------------------------------------------------------------------------------------ Invalid input(s): POCBNP ---------------------------------------------------------------------------------------------------------------  RADIOLOGY: Dg Chest 1 View  11/30/2014   CLINICAL DATA:  Fall onto left side. Left hip pain. Left hip fracture.  EXAM: CHEST  1 VIEW   COMPARISON:  06/12/2014  FINDINGS: Atherosclerotic and tortuous thoracic aorta. Borderline enlargement of the cardiopericardial silhouette. Bilateral interstitial accentuation, some of which is chronic but some of which is likely acute.  Biapical pleural parenchymal scarring. Left rib deformities likely from old fractures. Bony demineralization.  IMPRESSION: 1. Borderline cardiomegaly with suspected pulmonary venous hypertension. There is also some chronic interstitial accentuation. 2. Biapical pleural parenchymal scarring. 3. Bony demineralization.   Electronically Signed   By: Gaylyn Rong M.D.   On: 11/30/2014 07:52   Ct Head Wo Contrast  11/30/2014   CLINICAL DATA:  Fall while gardening, injuring the left leg.  EXAM: CT HEAD WITHOUT CONTRAST  TECHNIQUE: Contiguous axial images were obtained from the base of the skull through the vertex without intravenous contrast.  COMPARISON:  06/12/2014  FINDINGS: Skull and Sinuses:Negative for fracture or destructive process. The mastoids, middle ears, and imaged paranasal sinuses are clear.  Orbits: Cataract resections.  No traumatic findings  Brain: No evidence of acute infarction, hemorrhage, hydrocephalus, or mass lesion/mass effect.  Remote infarcts of the upper brain stem, left upper cerebellum, left thalamus, bilateral caudate/putamen, and left parietal lobe cortex and subcortical white matter. There is also generalized cerebral volume loss, mildly advanced for age.  Extensive intracranial atherosclerotic calcification, with notable calcification along the proximal anterior cerebral arteries.  IMPRESSION: 1. No evidence of intracranial injury. 2. Advanced chronic ischemic injury from small and medium vessel disease.   Electronically Signed   By: Marnee Spring M.D.   On: 11/30/2014 07:30   Dg Hip Operative Unilat With Pelvis Left  11/30/2014   CLINICAL DATA:  LEFT hip fracture requiring operative repair  EXAM: OPERATIVE LEFT HIP (WITH PELVIS IF PERFORMED)  2 VIEWS  TECHNIQUE: Fluoroscopic spot image(s) were submitted for interpretation post-operatively.  FLUOROSCOPY TIME:  Radiation Exposure Index (as provided by the fluoroscopic device): Not provided  If the device does not provide the exposure index:  Fluoroscopy Time:  0 minutes 35 seconds  Number of Acquired Images:  2  COMPARISON:  11/30/2014  FINDINGS: Two submitted views demonstrate placement of an IM nail with a compression screw at the proximal LEFT femur across a reduced intertrochanteric fracture.  Bones diffusely demineralized.  No dislocation or acute complication seen.  The distal extent of the IM nail however is not imaged by this study.  IMPRESSION: Post ORIF of an intertrochanteric fracture LEFT femur as above.   Electronically Signed   By: Ulyses Southward M.D.   On: 11/30/2014 16:58   Dg Hip Unilat With Pelvis 2-3 Views Left  11/30/2014   CLINICAL DATA:  Fall onto left hip.  Left hip pain.  EXAM: LEFT HIP (WITH PELVIS) 2-3 VIEWS  COMPARISON:  None.  FINDINGS: Left hip intertrochanteric fracture observed with varus angulation and separate lesser trochanteric fragment.  Vascular calcification. Lower lumbar degenerative disc disease with prominent loss of intervertebral disc height.  Left pubic ramus deformities are present but are probably old.  IMPRESSION: 1. Acute left hip intertrochanteric fracture with varus angulation and separate lesser trochanteric fragment. 2. Deformity of the left pubic rami. On the prior lumbar spine radiographs from 07/02/2011, the upper ramus deformity was present previously and accordingly these pubic rami fractures are probably old. 3. Lower lumbar spondylosis and degenerative disc disease. 4. Vascular calcifications.   Electronically Signed   By: Gaylyn Rong M.D.   On: 11/30/2014 07:54   Ct Image Guided Drainage By Percutaneous Catheter  11/30/2014   CLINICAL DATA:  Urinary  retention, distended urinary bladder. Foley catheter cannot be advanced.  EXAM: CT GUIDED  SUPRAPUBIC CATHETER PLACEMENT  ANESTHESIA/SEDATION: Intravenous Fentanyl and Versed were administered as conscious sedation during continuous cardiorespiratory monitoring by the radiology RN, with a total moderate sedation time of 26 minutes.  PROCEDURE: The procedure, risks, benefits, and alternatives were explained to the patient. Questions regarding the procedure were encouraged and answered. The patient understands and consents to the procedure.  select axial scans through the lower pelvis were obtained. an appropriate skin entry site was determined and marked.  The operative field was prepped with chlorhexidinein a sterile fashion, and a sterile drape was applied covering the operative field. A sterile gown and sterile gloves were used for the procedure. Local anesthesia was provided with 1% Lidocaine.  Under CT fluoroscopic guidance, an 18 gauge trocar needle was advanced into the urinary bladder using a midline suprapubic approach. Urine could be aspirated. An Amplatz guidewire advanced easily. The tract was dilated to facilitate placement of a 12 French pigtail catheter, position confirmed on CT fluoroscopy. Catheter secured externally with 0 Prolene suture and StatLock and placed to gravity bag. The patient tolerated the procedure well.  COMPLICATIONS: None immediate  FINDINGS: The urinary bladder is distended. Suprapubic catheter placed under CT fluoroscopic guidance. Comminuted left intertrochanteric femur fracture incidentally noted.  IMPRESSION: 1. Technically successful suprapubic catheter placement under CT guidance.   Electronically Signed   By: Corlis Leak M.D.   On: 11/30/2014 15:33    EKG:  Orders placed or performed during the hospital encounter of 11/30/14  . EKG 12-Lead  . EKG 12-Lead    ASSESSMENT AND PLAN:  Principal Problem:   Closed left hip fracture Active Problems:   Hip fracture   HTN (hypertension)   Weight loss   Gastroesophageal reflux disease without esophagitis    Stroke   COPD (chronic obstructive pulmonary disease)   Urinary retention  1. Closed left hip intratrochanteric fracture, status post trochanteric femoral nail placement on 11/30/2014 by Dr. Deeann Saint. Continue pain medications, appreciate orthopedist surgeon input, continue physical therapy, rehabilitation, placement will be needed . 2. Renal insufficiency, likely due to obstruction by her urologist, status post suprapubic catheter placement by interventional radiologist on 11/30/2014. Continue patient on IV fluids. Improving creatinine , urinalysis was not suspicious for UTI 3. Dysuria, suspected UTI, but UA is unremarkable, very likely patient's dysuria is related to labial fusion and difficulty voiding 4. History of essential hypertension . Patient is not hypertensive at present. We'll follow patient's blood pressure readings very closely  5. Cardiac murmurs. Echocardiogram showed normal ejection fraction,  no regional wall motion abnormalities, aortic valve revealed moderate stenosis and mitral valve showed calcified annulus,  6. COPD continue oxygen and inhalation therapy as needed  7. Urinary retention due to complete labial fusion,  urologist recommends Clobestazol twice daily as well as her Estrogen at bedtime, also follow up with urology in the next few weeks after discharge 8. Acute posthemorrhagic anemia. Transfuse 1 unit of blood today, follow patient's hemoglobin tomorrow morning 9. Hypotension. Patient was given bolus of IV fluids, likely acute with hemorrhagic anemia related, transfuse patient has positive blood cells and follow hemoglobin level as well as blood pressure readings 10. Mild dyspnea. Get chest x-ray to rule out CHF due to valvular disease  Management plans discussed with the patient, family and they are in agreement.   DRUG ALLERGIES:  Allergies  Allergen Reactions  . Lovastatin Shortness Of Breath    CODE STATUS:  Code Status Orders         Start     Ordered   11/30/14 1809  Full code   Continuous     11/30/14 1808      TOTAL TIME TAKING CARE OF THIS PATIENT: 45 minutes.    Katharina Caper M.D on 12/01/2014 at 11:52 AM  Between 7am to 6pm - Pager - [phone]  After 6pm go to [email or web address] - password [email or web address] [facility]  [facility]  Office  [phone]  CC: Primary care physician; No primary care provider on file.

## 2014-12-01 NOTE — Progress Notes (Signed)
Patient post op from left hip nailing . B/p low with 500 cc boluses given throuhout shift x 4 . B/p at 89/46. Awake alert resp. Good no o2 cannula. Pulse wnl afebrileice to left hip. Iv abx given . Will continue to monitor

## 2014-12-01 NOTE — Progress Notes (Signed)
Blood pressure medications held this am due to low bp 97/44, hr-80- microzide, lisinopril, atenolol. Dr.Vaickute notified.

## 2014-12-01 NOTE — Evaluation (Signed)
Occupational Therapy Evaluation Patient Details Name: Allison Bowman MRN: [id number] DOB: [birth year] Today's Date: 12/01/2014    History of Present Illness This patient is a 81 year old female who came to [facility] after a fall suffering a left hip fracture.    Clinical Impression   This patient is a 81 year old female who came to [facility] with the above problem. She lives with her son and she reports independence with basic ADL. She is confused. She now needs assist with dressing listed below and would benefit from Occupational Therapy for ADL/functioal mobility training.    Follow Up Recommendations  SNF    Equipment Recommendations       Recommendations for Other Services       Precautions / Restrictions Restrictions Weight Bearing Restrictions: Yes LLE Weight Bearing: Partial weight bearing      Mobility Bed Mobility                  Transfers                      Balance                                            ADL                                         General ADL Comments: Reports independenc for dressing. Patient now needs assist. She was getting blood so illistrated how to use adapted devices to donn and doff socks and pants hand over hand. Taught her that with her type of sugery she does not have to use the hip kit.     Vision     Perception     Praxis      Pertinent Vitals/Pain Pain Assessment:  (Patient calmly reports that it is bad. Nurse present)     Hand Dominance     Extremity/Trunk Assessment Upper Extremity Assessment Upper Extremity Assessment: Generalized weakness           Communication Communication Communication:  (Understandable but confused)   Cognition Arousal/Alertness: Awake/alert Behavior During Therapy: WFL for tasks assessed/performed (exept for confusion) Overall Cognitive Status: Impaired/Different from baseline Area of Impairment:   (Oriented to person, place month. )                   General Comments       Exercises       Shoulder Instructions      Home Living Family/patient expects to be discharged to:: Skilled nursing facility                                        Prior Functioning/Environment Level of Independence:  (Patient reports she had been dressing herself but is confused)             OT Diagnosis: Generalized weakness   OT Problem List: Decreased strength;Decreased activity tolerance;Pain   OT Treatment/Interventions: Self-care/ADL training;Therapeutic exercise    OT Goals(Current goals can be found in the care plan section) Acute Rehab OT Goals Patient Stated Goal: Unable to state Potential to Achieve Goals: Good  OT Frequency: Min 1X/week  Barriers to D/C:            Co-evaluation              End of Session Equipment Utilized During Treatment:  (hip kit)  Activity Tolerance: Patient limited by fatigue Patient left: in chair;with call bell/phone within reach;with chair alarm set   Time: 1330-1346 OT Time Calculation (min): 16 min Charges:  OT Evaluation $Initial OT Evaluation Tier I: 1 Procedure G-Codes:    Myrene Galas, MS/OTR/L  12/01/2014, 2:09 PM

## 2014-12-01 NOTE — Progress Notes (Signed)
Subjective: 1 Day Post-Op Procedure(s) (LRB): INTRAMEDULLARY (IM) NAIL INTERTROCHANTRIC Left short  (Left)    Patient reports pain as mild. oob in chair.  hgb 8.0.  Only lost 25cc blood in surgery so drop is probably due to hydration and fx bleeding.  Dressing dry.  csm good.   Objective:   VITALS:   Filed Vitals:   12/01/14 1213  BP: 108/53  Pulse: 85  Temp: 97.8 F (36.6 C)  Resp: 20    Neurologically intact Neurovascular intact Sensation intact distally Intact pulses distally Dorsiflexion/Plantar flexion intact  LABS  Recent Labs  11/30/14 0629 12/01/14 0315  HGB 11.1* 8.0*  HCT 36.1  --   WBC 9.3  --   PLT 302  --      Recent Labs  11/30/14 0629 12/01/14 0315  NA 141 133*  K 3.6 3.2*  BUN 21* 22*  CREATININE 1.42* 1.13*  GLUCOSE 128* 109*     Recent Labs  11/30/14 0629  INR 0.92     Assessment/Plan: 1 Day Post-Op Procedure(s) (LRB): INTRAMEDULLARY (IM) NAIL INTERTROCHANTRIC Left short  (Left)   Advance diet Up with therapy Discharge to SNF  Transfuse 1 unit prbc for hgb 8.0.

## 2014-12-01 NOTE — Progress Notes (Signed)
Contacted dave howwer another iv bolus 500 cc .45% NS b/p 81/45

## 2014-12-01 NOTE — Progress Notes (Signed)
CSW met with Pt while she was up watching golf in her chair. CSW brought SNF bed offers for dc planning. Pt stated that CSW should speak with "Eddie." CSW called Pt's son while in the room and discussed bed offers. CSW left list for son's review, will follow up on Sunday for choice of facility.   Toma Copier, [place]

## 2014-12-02 DIAGNOSIS — N179 Acute kidney failure, unspecified: Secondary | ICD-10-CM

## 2014-12-02 DIAGNOSIS — R339 Retention of urine, unspecified: Secondary | ICD-10-CM

## 2014-12-02 DIAGNOSIS — Z466 Encounter for fitting and adjustment of urinary device: Secondary | ICD-10-CM | POA: Diagnosis not present

## 2014-12-02 DIAGNOSIS — Q525 Fusion of labia: Secondary | ICD-10-CM

## 2014-12-02 LAB — CBC
HEMATOCRIT: 31.6 % — AB (ref 35.0–47.0)
HEMOGLOBIN: 9.8 g/dL — AB (ref 12.0–16.0)
MCH: 26 pg (ref 26.0–34.0)
MCHC: 31 g/dL — ABNORMAL LOW (ref 32.0–36.0)
MCV: 83.8 fL (ref 80.0–100.0)
Platelets: 252 10*3/uL (ref 150–440)
RBC: 3.77 MIL/uL — ABNORMAL LOW (ref 3.80–5.20)
RDW: 16.4 % — AB (ref 11.5–14.5)
WBC: 8 10*3/uL (ref 3.6–11.0)

## 2014-12-02 LAB — BASIC METABOLIC PANEL
ANION GAP: 7 (ref 5–15)
BUN: 20 mg/dL (ref 6–20)
CO2: 24 mmol/L (ref 22–32)
CREATININE: 0.96 mg/dL (ref 0.44–1.00)
Calcium: 7.3 mg/dL — ABNORMAL LOW (ref 8.9–10.3)
Chloride: 107 mmol/L (ref 101–111)
GFR calc non Af Amer: 55 mL/min — ABNORMAL LOW (ref >60)
GLUCOSE: 115 mg/dL — AB (ref 65–99)
POTASSIUM: 3.5 mmol/L (ref 3.5–5.1)
SODIUM: 138 mmol/L (ref 135–145)

## 2014-12-02 LAB — TYPE AND SCREEN
ABO/RH(D): O POS
ANTIBODY SCREEN: NEGATIVE
Unit division: 0

## 2014-12-02 MED ORDER — ENOXAPARIN SODIUM 30 MG/0.3ML ~~LOC~~ SOLN
30.0000 mg | Freq: Two times a day (BID) | SUBCUTANEOUS | Status: DC
  Administered 2014-12-02 – 2014-12-03 (×3): 30 mg via SUBCUTANEOUS
  Filled 2014-12-02 (×3): qty 0.3

## 2014-12-02 MED ORDER — TRAMADOL HCL 50 MG PO TABS: 50.0000 mg | ORAL_TABLET | Freq: Four times a day (QID) | ORAL | Status: DC | PRN

## 2014-12-02 NOTE — Progress Notes (Signed)
Patient confused and excitable at start of shift. Stopped giving narcotic and giving only tylenol. Confusion clearing.supra pubic cath in place drainng yellow urine. Left hip pod#2 from left hip nailing. Will go to rehab on d/c

## 2014-12-02 NOTE — Progress Notes (Signed)
Subjective: 2 Days Post-Op Procedure(s) (LRB): INTRAMEDULLARY (IM) NAIL INTERTROCHANTRIC Left short  (Left)    Patient reports pain as mild. Alert. oob in chair today.  Progress with PT.  SNF when available.    Objective:   VITALS:   Filed Vitals:   12/02/14 1940  BP: 100/46  Pulse: 95  Temp: 99.3 F (37.4 C)  Resp: 18    Neurovascular intact Sensation intact distally Intact pulses distally Dorsiflexion/Plantar flexion intact Dressing dry  LABS  Recent Labs  11/30/14 0629 12/01/14 0315 12/01/14 1517 12/02/14 0332  HGB 11.1* 8.0* 10.0* 9.8*  HCT 36.1  --  31.2* 31.6*  WBC 9.3  --  8.9 8.0  PLT 302  --  250 252     Recent Labs  11/30/14 0629 12/01/14 0315 12/02/14 0332  NA 141 133* 138  K 3.6 3.2* 3.5  BUN 21* 22* 20  CREATININE 1.42* 1.13* 0.96  GLUCOSE 128* 109* 115*     Recent Labs  11/30/14 0629  INR 0.92     Assessment/Plan: 2 Days Post-Op Procedure(s) (LRB): INTRAMEDULLARY (IM) NAIL INTERTROCHANTRIC Left short  (Left)   Up with therapy Discharge to SNF

## 2014-12-02 NOTE — Progress Notes (Signed)
Physical Therapy Treatment Patient Details Name: Allison Bowman MRN: [id number] DOB: 05/29/1934 Today's Date: 12/02/2014    History of Present Illness fall with L hip fx and ORIF    PT Comments    Pt is able to increase AROM and gait confidence today and continues to report minimal pain. Pt very pleasant t/o session and shows good effort.   Follow Up Recommendations  SNF     Equipment Recommendations       Recommendations for Other Services       Precautions / Restrictions Restrictions Weight Bearing Restrictions: Yes LLE Weight Bearing: Partial weight bearing    Mobility  Bed Mobility Overal bed mobility: Needs Assistance Bed Mobility: Sit to Supine       Sit to supine: Mod assist      Transfers Overall transfer level: Needs assistance Equipment used: Rolling walker (2 wheeled)   Sit to Stand: Min assist         General transfer comment: Pt does better this session needing less cuing and appears much more comfortable with L LE WBing  Ambulation/Gait Ambulation/Gait assistance: Min assist Ambulation Distance (Feet): 25 Feet Assistive device: Rolling walker (2 wheeled)       General Gait Details: Pt with less hesitancy with L WBing and is able to use UEs better on walker and overall shows good effort   Stairs            Wheelchair Mobility    Modified Rankin (Stroke Patients Only)       Balance                                    Cognition Arousal/Alertness: Awake/alert   Overall Cognitive Status: History of cognitive impairments - at baseline                      Exercises General Exercises - Lower Extremity Ankle Circles/Pumps: AROM;10 reps Quad Sets: Strengthening;10 reps Short Arc Quad: AROM;10 reps Heel Slides: AAROM;10 reps Hip ABduction/ADduction: AAROM;10 reps    General Comments        Pertinent Vitals/Pain      Home Living                      Prior Function            PT  Goals (current goals can now be found in the care plan section) Progress towards PT goals: Progressing toward goals    Frequency  BID    PT Plan Current plan remains appropriate    Co-evaluation             End of Session Equipment Utilized During Treatment: Gait belt Activity Tolerance: Patient limited by fatigue;Patient limited by pain Patient left: with bed alarm set     Time: 0757-0821 PT Time Calculation (min) (ACUTE ONLY): 24 min  Charges:  $Gait Training: 8-22 mins $Therapeutic Exercise: 8-22 mins                    G Codes:     Loran Senters, PT, DPT [phone]  Malachi Pro 12/02/2014, 10:01 AM

## 2014-12-02 NOTE — Progress Notes (Signed)
ANTICOAGULATION CONSULT NOTE - Follow Up Consult  Anticoagulation [place]   Allergies  Allergen Reactions  . Lovastatin Shortness Of Breath    Patient Measurements: Height: 5' (152.4 cm) Weight: 89 lb 15.2 oz (40.801 kg) IBW/kg (Calculated) : 45.5   Vital Signs: Temp: 98.2 F (36.8 C) (06/19 0751) Temp Source: Oral (06/19 0751) BP: 125/57 mmHg (06/19 0751) Pulse Rate: 91 (06/19 0751)  Labs:  Recent Labs  11/30/14 0629 12/01/14 0315 12/01/14 1517 12/02/14 0332  HGB 11.1* 8.0* 10.0* 9.8*  HCT 36.1  --  31.2* 31.6*  PLT 302  --  250 252  APTT 24  --   --   --   LABPROT 12.6  --   --   --   INR 0.92  --   --   --   CREATININE 1.42* 1.13*  --  0.96    Estimated Creatinine Clearance: 31.1 mL/min (by C-G formula based on Cr of 0.96).   Medications:  Scheduled:  . atenolol  25 mg Oral Daily  . clobetasol cream   Topical BID  . conjugated estrogens  1 Applicatorful Vaginal QHS  . enoxaparin (LOVENOX) injection  30 mg Subcutaneous Q12H  . feeding supplement (ENSURE ENLIVE)  237 mL Oral BID BM  . ferrous sulfate  325 mg Oral Q breakfast  . lisinopril  5 mg Oral Daily  . memantine  5 mg Oral BID  . mirtazapine  30 mg Oral QHS  . senna  1 tablet Oral BID  . tranexamic acid (CYKLOKAPRON) IVPB (ORTHO)  1,000 mg Intravenous Once   Infusions:    Assessment: 81 yo female admitted with hip fracture ordered enoxaparin 30mg  daily.    Plan:  Patient's creatinine clearance ~ 48mL/min. Per protocol, will transition patient to enoxparin 30mg  BID.    Pharmacy will continue to monitor per protocol.    Kayton Dunaj L 12/02/2014,8:30 AM

## 2014-12-02 NOTE — Progress Notes (Signed)
CSW spoke to RN who reports that Pt's son has reviewed SNF offers and has selected [facility]	[place]. [facility] is still reviewing patients's information and will let CSW know on Monday if possible.  CSW will follow up with son for 2nd choice, she has many other options. Pt is possible dc on Monday.    Wilford Grist, LCSW [phone]

## 2014-12-02 NOTE — Progress Notes (Signed)
[facility] - [place] at [facility]   PATIENT NAME: Allison Bowman    MR#:  [id number]  DATE OF BIRTH:  24-Apr-1934  SUBJECTIVE:  CHIEF COMPLAINT:   Chief Complaint  Patient presents with  . Fall  . Hip Pain   Patient presented to the hospital after fall. She was not able to get up and not able to put weight to the left leg. She was brought to emergency room and was noted to have left hip intratrochanteric fracture. She was admitted to the hospital.  After admission she was noted to have urinary retention. Urology consultation was requested, since nurses were not able to place Foley catheter.  However urologist was also not able to place catheter due to labial adhesions, so patient was taken to operating room where interventional radiologist placed suprapubic catheter 11/30/2014. Then patient was taken to operating room by the orthopedist, Dr. Deeann Saint placed trochanteric femoral nail  to left hip fracture. Today patient is somewhat confused, however, denies any significant pain. She was able to walk some with physical therapy. Physical therapist recommends rehabilitation placement. Feels satisfactory today, complains of no significant discomfort is advancing with physical therapy. Seen by neurologist who felt that suprapubic catheter should be continued for 2 weeks until labial adhesions are treated, follow-up with urology as outpatient  Review of Systems  Unable to perform ROS: other   some confusion  VITAL SIGNS: Blood pressure 125/57, pulse 91, temperature 98.2 F (36.8 C), temperature source Oral, resp. rate 16, height 5' (1.524 m), weight 40.801 kg (89 lb 15.2 oz), SpO2 97 %.  PHYSICAL EXAMINATION:   GENERAL:  81 y.o.-year-old patient lying in the bed with no acute distress.  EYES: Pupils equal, round, reactive to light and accommodation. No scleral icterus. Extraocular muscles intact.  HEENT: Head atraumatic, normocephalic. Oropharynx and nasopharynx  clear.  NECK:  Supple, no jugular venous distention. No thyroid enlargement, no tenderness.  LUNGS: Diminished breath sounds bilaterally, diffuse, intermittent rales, rhonchi and crepitations, intermittent scattered wheezing. No use of accessory muscles of respiration. Patient is tachypneic.  CARDIOVASCULAR: S1, S2 normal. No murmurs, rubs, or gallops.  ABDOMEN: Soft, nontender, nondistended. Bowel sounds present. No organomegaly or mass.  EXTREMITIES: No pedal edema, cyanosis, or clubbing.  NEUROLOGIC: Cranial nerves II through XII are intact. Muscle strength 5/5 in all extremities. Sensation intact. Gait not checked.  PSYCHIATRIC: The patient is alert and oriented x 3.  SKIN: No obvious rash, lesion, or ulcer.   ORDERS/RESULTS REVIEWED:   CBC  Recent Labs Lab 11/30/14 0629 12/01/14 0315 12/01/14 1517 12/02/14 0332  WBC 9.3  --  8.9 8.0  HGB 11.1* 8.0* 10.0* 9.8*  HCT 36.1  --  31.2* 31.6*  PLT 302  --  250 252  MCV 87.5  --  83.6 83.8  MCH 27.0  --  26.8 26.0  MCHC 30.8*  --  32.0 31.0*  RDW 14.7*  --  15.7* 16.4*   ------------------------------------------------------------------------------------------------------------------  Chemistries   Recent Labs Lab 11/30/14 0629 12/01/14 0315 12/02/14 0332  NA 141 133* 138  K 3.6 3.2* 3.5  CL 106 104 107  CO2 26 24 24   GLUCOSE 128* 109* 115*  BUN 21* 22* 20  CREATININE 1.42* 1.13* 0.96  CALCIUM 9.0 6.7* 7.3*  AST 24  --   --   ALT 13*  --   --   ALKPHOS 98  --   --   BILITOT 0.7  --   --    ------------------------------------------------------------------------------------------------------------------  estimated creatinine clearance is 31.1 mL/min (by C-G formula based on Cr of 0.96). ------------------------------------------------------------------------------------------------------------------  Recent Labs  12/01/14 0315  TSH 2.505    Cardiac Enzymes No results for input(s): CKMB, TROPONINI, MYOGLOBIN in  the last 168 hours.  Invalid input(s): CK ------------------------------------------------------------------------------------------------------------------ Invalid input(s): POCBNP ---------------------------------------------------------------------------------------------------------------  RADIOLOGY: Dg Chest Port 1 View  12/01/2014   CLINICAL DATA:  New onset shortness of breath, 1 day after surgery for hip fracture repair  EXAM: PORTABLE CHEST - 1 VIEW  COMPARISON:  November 30, 2014  FINDINGS: There is slight bibasilar atelectasis. No frank edema or consolidation. Heart is borderline prominent with pulmonary vascularity within normal limits. No adenopathy. There are foci of atherosclerotic calcification in the aorta and axillary arteries.  IMPRESSION: Mild bibasilar atelectasis, slightly more on the right than on the left. No frank edema or consolidation. Heart mildly prominent but stable.   Electronically Signed   By: Bretta Bang III M.D.   On: 12/01/2014 14:07   Dg Hip Operative Unilat With Pelvis Left  11/30/2014   CLINICAL DATA:  LEFT hip fracture requiring operative repair  EXAM: OPERATIVE LEFT HIP (WITH PELVIS IF PERFORMED) 2 VIEWS  TECHNIQUE: Fluoroscopic spot image(s) were submitted for interpretation post-operatively.  FLUOROSCOPY TIME:  Radiation Exposure Index (as provided by the fluoroscopic device): Not provided  If the device does not provide the exposure index:  Fluoroscopy Time:  0 minutes 35 seconds  Number of Acquired Images:  2  COMPARISON:  11/30/2014  FINDINGS: Two submitted views demonstrate placement of an IM nail with a compression screw at the proximal LEFT femur across a reduced intertrochanteric fracture.  Bones diffusely demineralized.  No dislocation or acute complication seen.  The distal extent of the IM nail however is not imaged by this study.  IMPRESSION: Post ORIF of an intertrochanteric fracture LEFT femur as above.   Electronically Signed   By: Ulyses Southward  M.D.   On: 11/30/2014 16:58   Ct Image Guided Drainage By Percutaneous Catheter  11/30/2014   CLINICAL DATA:  Urinary retention, distended urinary bladder. Foley catheter cannot be advanced.  EXAM: CT GUIDED SUPRAPUBIC CATHETER PLACEMENT  ANESTHESIA/SEDATION: Intravenous Fentanyl and Versed were administered as conscious sedation during continuous cardiorespiratory monitoring by the radiology RN, with a total moderate sedation time of 26 minutes.  PROCEDURE: The procedure, risks, benefits, and alternatives were explained to the patient. Questions regarding the procedure were encouraged and answered. The patient understands and consents to the procedure.  select axial scans through the lower pelvis were obtained. an appropriate skin entry site was determined and marked.  The operative field was prepped with chlorhexidinein a sterile fashion, and a sterile drape was applied covering the operative field. A sterile gown and sterile gloves were used for the procedure. Local anesthesia was provided with 1% Lidocaine.  Under CT fluoroscopic guidance, an 18 gauge trocar needle was advanced into the urinary bladder using a midline suprapubic approach. Urine could be aspirated. An Amplatz guidewire advanced easily. The tract was dilated to facilitate placement of a 12 French pigtail catheter, position confirmed on CT fluoroscopy. Catheter secured externally with 0 Prolene suture and StatLock and placed to gravity bag. The patient tolerated the procedure well.  COMPLICATIONS: None immediate  FINDINGS: The urinary bladder is distended. Suprapubic catheter placed under CT fluoroscopic guidance. Comminuted left intertrochanteric femur fracture incidentally noted.  IMPRESSION: 1. Technically successful suprapubic catheter placement under CT guidance.   Electronically Signed   By: Ronald Pippins.D.  On: 11/30/2014 15:33    EKG:  Orders placed or performed during the hospital encounter of 11/30/14  . EKG 12-Lead  . EKG  12-Lead    ASSESSMENT AND PLAN:  Principal Problem:   Closed left hip fracture Active Problems:   Hip fracture   HTN (hypertension)   Weight loss   Gastroesophageal reflux disease without esophagitis   Stroke   COPD (chronic obstructive pulmonary disease)   Urinary retention  1. Closed left hip intratrochanteric fracture, status post trochanteric femoral nail placement on 11/30/2014 by Dr. Deeann Saint. Continue pain medications, appreciate orthopedist surgeon input, continue physical therapy, rehabilitation, placement will be needed . 2. Renal insufficiency, likely due to obstruction by her urologist, status post suprapubic catheter placement by interventional radiologist on 11/30/2014. Continue patient on low rate IV fluids. Improved creatinine , urinalysis was not suspicious for UTI 3. Dysuria, suspected UTI. Initially, but UA was unremarkable, very likely patient's dysuria is related to labial fusion and difficulty voiding, which she admits 4. History of essential hypertension . Patient is not hypertensive at present. We'll follow patient's blood pressure readings very closely , stable at present 5. Cardiac murmurs. Echocardiogram showed normal ejection fraction,  no regional wall motion abnormalities, aortic valve revealed moderate stenosis and mitral valve showed calcified annulus, stable and no CHF signs 6. COPD continue oxygen and inhalation therapy as needed  7. Urinary retention due to complete labial fusion,  urologist recommends Clobetazol twice daily as well as her Estrogen at bedtime, trying to open patient's labial adhesions,  follow up with urology in the next few weeks after discharge. Patient is to continue suprapubic catheter for the next few weeks until she seen by urologist.  8. Acute posthemorrhagic anemia. Transfuse 1 unit of blood yesterday, good patient's hemoglobin level today at 9.8 9. Hypotension. Resolved after IV fluids and transfusion 10. Mild dyspnea.  Chest x-ray showed no CHF , but may be slight atelectasis which is monitored right than on the left. Continue incentive spirometry  Management plans discussed with the patient, family and they are in agreement.   DRUG ALLERGIES:  Allergies  Allergen Reactions  . Lovastatin Shortness Of Breath    CODE STATUS:     Code Status Orders        Start     Ordered   11/30/14 1809  Full code   Continuous     11/30/14 1808      TOTAL TIME TAKING CARE OF THIS PATIENT: 40 minutes.    Katharina Caper M.D on 12/02/2014 at 10:44 AM  Between 7am to 6pm - Pager - [phone]  After 6pm go to [email or web address] - password [email or web address]  [place] [facility]  Office  [phone]  CC: Primary care physician; No primary care provider on file.

## 2014-12-02 NOTE — Progress Notes (Signed)
Patient ID: Allison Bowman, female   DOB: 05/10/1934, 81 y.o.   MRN: [id number]  2 Days Post-Op  Subjective: Mrs. Gagnier is doing well with the suprapubic tube and has no complaints.  ROS:  Review of Systems  Constitutional: Negative for fever.  Genitourinary: Negative for hematuria.    Anti-infectives: Anti-infectives    Start     Dose/Rate Route Frequency Ordered Stop   11/30/14 1815  ceFAZolin (ANCEF) IVPB 2 g/50 mL premix     2 g 100 mL/hr over 30 Minutes Intravenous Every 6 hours 11/30/14 1808 12/01/14 1814   11/30/14 1715  clindamycin (CLEOCIN) IVPB 600 mg     600 mg 100 mL/hr over 30 Minutes Intravenous 3 times per day 11/30/14 1703 12/01/14 1059   11/30/14 1300  ceFAZolin (ANCEF) IVPB 2 g/50 mL premix     2 g 100 mL/hr over 30 Minutes Intravenous  Once 11/30/14 0856 11/30/14 1544   11/30/14 1300  clindamycin (CLEOCIN) IVPB 600 mg     600 mg 100 mL/hr over 30 Minutes Intravenous  Once 11/30/14 0856 11/30/14 1535      Current Facility-Administered Medications  Medication Dose Route Frequency Provider Last Rate Last Dose  . acetaminophen (TYLENOL) tablet 650 mg  650 mg Oral Q6H PRN Deeann Saint, MD   650 mg at 12/02/14 4098   Or  . acetaminophen (TYLENOL) suppository 650 mg  650 mg Rectal Q6H PRN Deeann Saint, MD      . alum & mag hydroxide-simeth (MAALOX/MYLANTA) 200-200-20 MG/5ML suspension 30 mL  30 mL Oral Q4H PRN Deeann Saint, MD      . atenolol (TENORMIN) tablet 25 mg  25 mg Oral Daily Katharina Caper, MD   25 mg at 12/02/14 0902  . bisacodyl (DULCOLAX) suppository 10 mg  10 mg Rectal Daily PRN Deeann Saint, MD      . clobetasol cream (TEMOVATE) 0.05 %   Topical BID Katharina Caper, MD      . conjugated estrogens (PREMARIN) vaginal cream 1 Applicatorful  1 Applicatorful Vaginal QHS Katharina Caper, MD   1 Applicatorful at 12/01/14 2159  . enoxaparin (LOVENOX) injection 30 mg  30 mg Subcutaneous Q12H Bertram Savin, RPH      . feeding supplement (ENSURE ENLIVE)  (ENSURE ENLIVE) liquid 237 mL  237 mL Oral BID BM Katharina Caper, MD   237 mL at 12/01/14 1700  . ferrous sulfate tablet 325 mg  325 mg Oral Q breakfast Katharina Caper, MD   325 mg at 12/02/14 0901  . ipratropium-albuterol (DUONEB) 0.5-2.5 (3) MG/3ML nebulizer solution 3 mL  3 mL Nebulization Q4H PRN Katharina Caper, MD      . lisinopril (PRINIVIL,ZESTRIL) tablet 5 mg  5 mg Oral Daily Deeann Saint, MD   5 mg at 12/02/14 0901  . magnesium hydroxide (MILK OF MAGNESIA) suspension 30 mL  30 mL Oral Daily PRN Deeann Saint, MD   30 mL at 12/02/14 0903  . memantine (NAMENDA) tablet 5 mg  5 mg Oral BID Katharina Caper, MD   5 mg at 12/02/14 0900  . menthol-cetylpyridinium (CEPACOL) lozenge 3 mg  1 lozenge Oral PRN Deeann Saint, MD       Or  . phenol (CHLORASEPTIC) mouth spray 1 spray  1 spray Mouth/Throat PRN Deeann Saint, MD      . metoCLOPramide (REGLAN) tablet 5-10 mg  5-10 mg Oral Q8H PRN Deeann Saint, MD       Or  . metoCLOPramide (REGLAN) injection 5-10 mg  5-10 mg  Intravenous Q8H PRN Deeann Saint, MD      . mirtazapine (REMERON) tablet 30 mg  30 mg Oral QHS Katharina Caper, MD   30 mg at 12/01/14 2159  . morphine 2 MG/ML injection 0.5 mg  0.5 mg Intravenous Q2H PRN Deeann Saint, MD      . ondansetron [facility]) tablet 4 mg  4 mg Oral Q6H PRN Deeann Saint, MD       Or  . ondansetron [facility]) injection 4 mg  4 mg Intravenous Q6H PRN Deeann Saint, MD   4 mg at 12/01/14 2204  . senna (SENOKOT) tablet 8.6 mg  1 tablet Oral BID Deeann Saint, MD   8.6 mg at 12/02/14 0901  . traMADol (ULTRAM) tablet 50 mg  50 mg Oral Q6H PRN Katharina Caper, MD      . tranexamic acid (CYKLOKAPRON) 1,000 mg in sodium chloride 0.9 % 100 mL IVPB  1,000 mg Intravenous Once Deeann Saint, MD      . zolpidem (AMBIEN) tablet 5 mg  5 mg Oral QHS PRN Deeann Saint, MD         Objective: Vital signs in last 24 hours: Temp:  [97.7 F (36.5 C)-98.7 F (37.1 C)] 98.2 F (36.8 C) (06/19 0751) Pulse Rate:  [78-105] 91 (06/19  0751) Resp:  [16-20] 16 (06/19 0751) BP: (78-149)/(49-68) 125/57 mmHg (06/19 0751) SpO2:  [95 %-97 %] 97 % (06/19 0751)  Intake/Output from previous day: 06/18 0701 - 06/19 0700 In: 1426 [P.O.:360; I.V.:1066] Out: 2825 [Urine:2825] Intake/Output this shift: Total I/O In: -  Out: 150 [Urine:150]   Physical Exam  Constitutional: She is well-developed, well-nourished, and in no distress.  Genitourinary:  Suprapubic tube site is clean and dry.  Urine is clear.     Lab Results:   Recent Labs  12/01/14 1517 12/02/14 0332  WBC 8.9 8.0  HGB 10.0* 9.8*  HCT 31.2* 31.6*  PLT 250 252   BMET  Recent Labs  12/01/14 0315 12/02/14 0332  NA 133* 138  K 3.2* 3.5  CL 104 107  CO2 24 24  GLUCOSE 109* 115*  BUN 22* 20  CREATININE 1.13* 0.96  CALCIUM 6.7* 7.3*   PT/INR  Recent Labs  11/30/14 0629  LABPROT 12.6  INR 0.92   ABG No results for input(s): PHART, HCO3 in the last 72 hours.  Invalid input(s): PCO2, PO2  Studies/Results: Dg Chest Port 1 View  12/01/2014   CLINICAL DATA:  New onset shortness of breath, 1 day after surgery for hip fracture repair  EXAM: PORTABLE CHEST - 1 VIEW  COMPARISON:  November 30, 2014  FINDINGS: There is slight bibasilar atelectasis. No frank edema or consolidation. Heart is borderline prominent with pulmonary vascularity within normal limits. No adenopathy. There are foci of atherosclerotic calcification in the aorta and axillary arteries.  IMPRESSION: Mild bibasilar atelectasis, slightly more on the right than on the left. No frank edema or consolidation. Heart mildly prominent but stable.   Electronically Signed   By: Bretta Bang III M.D.   On: 12/01/2014 14:07   Dg Hip Operative Unilat With Pelvis Left  11/30/2014   CLINICAL DATA:  LEFT hip fracture requiring operative repair  EXAM: OPERATIVE LEFT HIP (WITH PELVIS IF PERFORMED) 2 VIEWS  TECHNIQUE: Fluoroscopic spot image(s) were submitted for interpretation post-operatively.   FLUOROSCOPY TIME:  Radiation Exposure Index (as provided by the fluoroscopic device): Not provided  If the device does not provide the exposure index:  Fluoroscopy Time:  0 minutes 35  seconds  Number of Acquired Images:  2  COMPARISON:  11/30/2014  FINDINGS: Two submitted views demonstrate placement of an IM nail with a compression screw at the proximal LEFT femur across a reduced intertrochanteric fracture.  Bones diffusely demineralized.  No dislocation or acute complication seen.  The distal extent of the IM nail however is not imaged by this study.  IMPRESSION: Post ORIF of an intertrochanteric fracture LEFT femur as above.   Electronically Signed   By: Ulyses Southward M.D.   On: 11/30/2014 16:58   Ct Image Guided Drainage By Percutaneous Catheter  11/30/2014   CLINICAL DATA:  Urinary retention, distended urinary bladder. Foley catheter cannot be advanced.  EXAM: CT GUIDED SUPRAPUBIC CATHETER PLACEMENT  ANESTHESIA/SEDATION: Intravenous Fentanyl and Versed were administered as conscious sedation during continuous cardiorespiratory monitoring by the radiology RN, with a total moderate sedation time of 26 minutes.  PROCEDURE: The procedure, risks, benefits, and alternatives were explained to the patient. Questions regarding the procedure were encouraged and answered. The patient understands and consents to the procedure.  select axial scans through the lower pelvis were obtained. an appropriate skin entry site was determined and marked.  The operative field was prepped with chlorhexidinein a sterile fashion, and a sterile drape was applied covering the operative field. A sterile gown and sterile gloves were used for the procedure. Local anesthesia was provided with 1% Lidocaine.  Under CT fluoroscopic guidance, an 18 gauge trocar needle was advanced into the urinary bladder using a midline suprapubic approach. Urine could be aspirated. An Amplatz guidewire advanced easily. The tract was dilated to facilitate  placement of a 12 French pigtail catheter, position confirmed on CT fluoroscopy. Catheter secured externally with 0 Prolene suture and StatLock and placed to gravity bag. The patient tolerated the procedure well.  COMPLICATIONS: None immediate  FINDINGS: The urinary bladder is distended. Suprapubic catheter placed under CT fluoroscopic guidance. Comminuted left intertrochanteric femur fracture incidentally noted.  IMPRESSION: 1. Technically successful suprapubic catheter placement under CT guidance.   Electronically Signed   By: Corlis Leak M.D.   On: 11/30/2014 15:33     Assessment: s/p Procedure(s): INTRAMEDULLARY (IM) NAIL INTERTROCHANTRIC Left short   Doing well with SP tube.   Plan: She will need the SP tube for a couple of weeks and then will need to have treatment of the labial adhesions when more recovered from the hip fracture.      LOS: 2 days    Anner Crete 12/02/2014

## 2014-12-02 NOTE — Progress Notes (Signed)
Post-op day 3 s/p IM nail left hip, doing well today, no complain of pain, left hip dressing - dry/intact/ with old drainage. Maintain hip precautions. Had bowel movement today. IV Site intact R outer forearm. Patient to be discharge to Rehab with suprapubic cath. Assisted to [facility] as needed, tolerating regular diet well.

## 2014-12-03 ENCOUNTER — Encounter: Payer: Self-pay | Admitting: Specialist

## 2014-12-03 DIAGNOSIS — S72002D Fracture of unspecified part of neck of left femur, subsequent encounter for closed fracture with routine healing: Secondary | ICD-10-CM | POA: Diagnosis not present

## 2014-12-03 DIAGNOSIS — Z466 Encounter for fitting and adjustment of urinary device: Secondary | ICD-10-CM | POA: Diagnosis not present

## 2014-12-03 DIAGNOSIS — R339 Retention of urine, unspecified: Secondary | ICD-10-CM | POA: Diagnosis not present

## 2014-12-03 DIAGNOSIS — N289 Disorder of kidney and ureter, unspecified: Secondary | ICD-10-CM | POA: Diagnosis not present

## 2014-12-03 DIAGNOSIS — K219 Gastro-esophageal reflux disease without esophagitis: Secondary | ICD-10-CM | POA: Diagnosis not present

## 2014-12-03 DIAGNOSIS — R300 Dysuria: Secondary | ICD-10-CM | POA: Diagnosis not present

## 2014-12-03 DIAGNOSIS — Z8673 Personal history of transient ischemic attack (TIA), and cerebral infarction without residual deficits: Secondary | ICD-10-CM | POA: Diagnosis not present

## 2014-12-03 DIAGNOSIS — Z7401 Bed confinement status: Secondary | ICD-10-CM | POA: Diagnosis not present

## 2014-12-03 DIAGNOSIS — Q899 Congenital malformation, unspecified: Secondary | ICD-10-CM | POA: Diagnosis not present

## 2014-12-03 DIAGNOSIS — K5900 Constipation, unspecified: Secondary | ICD-10-CM | POA: Diagnosis not present

## 2014-12-03 DIAGNOSIS — E46 Unspecified protein-calorie malnutrition: Secondary | ICD-10-CM | POA: Diagnosis not present

## 2014-12-03 DIAGNOSIS — S72142A Displaced intertrochanteric fracture of left femur, initial encounter for closed fracture: Secondary | ICD-10-CM | POA: Diagnosis not present

## 2014-12-03 DIAGNOSIS — N179 Acute kidney failure, unspecified: Secondary | ICD-10-CM | POA: Diagnosis not present

## 2014-12-03 DIAGNOSIS — I10 Essential (primary) hypertension: Secondary | ICD-10-CM | POA: Diagnosis not present

## 2014-12-03 DIAGNOSIS — F0390 Unspecified dementia without behavioral disturbance: Secondary | ICD-10-CM | POA: Diagnosis not present

## 2014-12-03 DIAGNOSIS — R630 Anorexia: Secondary | ICD-10-CM | POA: Diagnosis not present

## 2014-12-03 DIAGNOSIS — R011 Cardiac murmur, unspecified: Secondary | ICD-10-CM | POA: Diagnosis not present

## 2014-12-03 DIAGNOSIS — R12 Heartburn: Secondary | ICD-10-CM | POA: Diagnosis not present

## 2014-12-03 DIAGNOSIS — J449 Chronic obstructive pulmonary disease, unspecified: Secondary | ICD-10-CM | POA: Diagnosis not present

## 2014-12-03 DIAGNOSIS — S72142D Displaced intertrochanteric fracture of left femur, subsequent encounter for closed fracture with routine healing: Secondary | ICD-10-CM | POA: Diagnosis not present

## 2014-12-03 DIAGNOSIS — D649 Anemia, unspecified: Secondary | ICD-10-CM | POA: Diagnosis not present

## 2014-12-03 DIAGNOSIS — Z9181 History of falling: Secondary | ICD-10-CM | POA: Diagnosis not present

## 2014-12-03 DIAGNOSIS — Q525 Fusion of labia: Secondary | ICD-10-CM | POA: Diagnosis not present

## 2014-12-03 LAB — CBC
HCT: 29.4 % — ABNORMAL LOW (ref 35.0–47.0)
HEMOGLOBIN: 9.9 g/dL — AB (ref 12.0–16.0)
MCH: 27.7 pg (ref 26.0–34.0)
MCHC: 33.5 g/dL (ref 32.0–36.0)
MCV: 82.8 fL (ref 80.0–100.0)
PLATELETS: 256 10*3/uL (ref 150–440)
RBC: 3.56 MIL/uL — AB (ref 3.80–5.20)
RDW: 16.6 % — AB (ref 11.5–14.5)
WBC: 9.6 10*3/uL (ref 3.6–11.0)

## 2014-12-03 MED ORDER — ESTROGENS, CONJUGATED 0.625 MG/GM VA CREA: 1.0000 | TOPICAL_CREAM | Freq: Every day | VAGINAL | Status: DC

## 2014-12-03 MED ORDER — TRAMADOL HCL 50 MG PO TABS: 50.0000 mg | ORAL_TABLET | Freq: Four times a day (QID) | ORAL | Status: DC | PRN

## 2014-12-03 MED ORDER — ALUM & MAG HYDROXIDE-SIMETH 200-200-20 MG/5ML PO SUSP: 30.0000 mL | ORAL | Status: DC | PRN

## 2014-12-03 MED ORDER — ACETAMINOPHEN 325 MG PO TABS: 650.0000 mg | ORAL_TABLET | Freq: Four times a day (QID) | ORAL | Status: AC | PRN

## 2014-12-03 MED ORDER — SENNA 8.6 MG PO TABS: 1.0000 | ORAL_TABLET | Freq: Two times a day (BID) | ORAL | Status: DC

## 2014-12-03 MED ORDER — CLOBETASOL PROPIONATE 0.05 % EX CREA: TOPICAL_CREAM | Freq: Two times a day (BID) | CUTANEOUS | Status: DC

## 2014-12-03 NOTE — Progress Notes (Signed)
Clinical Social Worker (CSW) met with patient to get SNF choice. Per patient her son Herbie Baltimore will make the decision. CSW contacted Herbie Baltimore to get choice. He chose [facility]. CSW also made son and [facility] aware that patient has a superpubic cath. Patient is medically stable for D/C to [facility] today. Per [facility] admissions coordinator at [facility] patient is going to room 402. RN will call report and arrange EMS for transport. Clinical Education officer, museum (CSW) prepared D/C packet and sent D/C Summary to [facility] via carefinder. CSW contacted patient's son Herbie Baltimore and made him aware of above. Please reconsult if future social work needs arise. CSW signing off.   Blima Rich, [place] [phone]

## 2014-12-03 NOTE — Discharge Instructions (Signed)
Hip Fracture °A hip fracture is a fracture of the upper part of your thigh bone (femur).  °CAUSES °A hip fracture is caused by a direct blow to the side of your hip. This is usually the result of a fall but can occur in other circumstances, such as an automobile accident. °RISK FACTORS °There is an increased risk of hip fractures in people with: °· An unsteady walking pattern (gait) and those with conditions that contribute to poor balance, such as Parkinson's disease or dementia. °· Osteopenia and osteoporosis. °· Cancer that spreads to the leg bones. °· Certain metabolic diseases. °SYMPTOMS  °Symptoms of hip fracture include: °· Pain over the injured hip. °· Inability to put weight on the leg in which the fracture occurred (although, some patients are able to walk after a hip fracture). °· Toes and foot of the affected leg point outward when you lie down. °DIAGNOSIS °A physical exam can determine if a hip fracture is likely to have occurred. X-ray exams are needed to confirm the fracture and to look for other injuries. The X-ray exam can help to determine the type of hip fracture. Rarely, the fracture is not visible on an X-ray image and a CT scan or MRI will have to be done. °TREATMENT  °The treatment for a fracture is usually surgery. This means using a screw, nail, or rod to hold the bones in place.  °HOME CARE INSTRUCTIONS °Take all medicines as directed by your health care provider. °SEEK MEDICAL CARE IF: °Pain continues, even after taking pain medicine. °MAKE SURE YOU: °· Understand these instructions.   °· Will watch your condition. °· Will get help right away if you are not doing well or get worse. °Document Released: 06/01/2005 Document Revised: 06/06/2013 Document Reviewed: 01/11/2013 °[employer]® Patient Information ©2015 [employer]. This information is not intended to replace advice given to you by your health care provider. Make sure you discuss any questions you have with your health care  provider. ° °

## 2014-12-03 NOTE — Clinical Social Work Placement (Signed)
   CLINICAL SOCIAL WORK PLACEMENT  NOTE  Date:  12/03/2014  Patient Details  Name: Allison Bowman MRN: [id number] Date of Birth: 1934/05/05  Clinical Social Work is seeking post-discharge placement for this patient at the Skilled  Nursing Facility level of care (*CSW will initial, date and re-position this form in  chart as items are completed):  Yes   Patient/family provided with [place] Clinical Social Work Department's list of facilities offering this level of care within the geographic area requested by the patient (or if unable, by the patient's family).  Yes   Patient/family informed of their freedom to choose among providers that offer the needed level of care, that participate in Medicare, Medicaid or managed care program needed by the patient, have an available bed and are willing to accept the patient.  Yes   Patient/family informed of [place]'s ownership interest in [facility] and [facility], as well as of the fact that they are under no obligation to receive care at these facilities.  PASRR submitted to EDS on 11/30/14     PASRR number received on 11/30/14     Existing PASRR number confirmed on       FL2 transmitted to all facilities in geographic area requested by pt/family on 11/30/14     FL2 transmitted to all facilities within larger geographic area on       Patient informed that his/her managed care company has contracts with or will negotiate with certain facilities, including the following:        Yes   Patient/family informed of bed offers received.  Patient chooses bed at  [facility] )     Physician recommends and patient chooses bed at      Patient to be transferred to  [facility] ) on 12/03/14.  Patient to be transferred to facility by  (EMS )     Patient family notified on 12/03/14 of transfer.  Name of family member notified:   (Son Molly Maduro via telephone. )     PHYSICIAN       Additional Comment:     _______________________________________________ Haig Prophet, LCSW 12/03/2014, 2:25 PM

## 2014-12-03 NOTE — Care Management Note (Signed)
Case Management Note  Patient Details  Name: Allison Bowman MRN: [id number] Date of Birth: 06/14/1934  Subjective/Objective:   Patient to discharge to [facility] today.  Will sign off               Action/Plan: [facility]  Expected Discharge Date:   12/03/2014               Expected Discharge Plan:  Skilled Nursing Facility  In-House Referral:  Clinical Social Work  Discharge planning Services     Post Acute Care Choice:    Choice offered to:     DME Arranged:    DME Agency:     HH Arranged:    HH Agency:     Status of Service:     Medicare Important Message Given:  Yes Date Medicare IM Given:  12/03/14 Medicare IM give by:  Gweneth Dimitri Date Additional Medicare IM Given:    Additional Medicare Important Message give by:     If discussed at Long Length of Stay Meetings, dates discussed:    Additional Comments:  Marily Memos, RN 12/03/2014, 12:06 PM

## 2014-12-03 NOTE — Progress Notes (Signed)
POD 3, left hip. Pt for d/c to [facility]. Pain controlled w/ reposition. OOB to [facility] w/ 1 assist. Suprapubic cath in place, to remain at discharge. Rested quietly during evening. No acute distress, will cont to monitor.

## 2014-12-03 NOTE — Progress Notes (Signed)
Nutrition Follow-up  INTERVENTION: Meals and Snacks: Cater to patient preferences Medical Food Supplement Therapy: will recommend continuing Ensure Enlive (each supplement provides 350kcal and 20 grams of protein) BID for added nutrition  NUTRITION DIAGNOSIS:  Inadequate oral intake related to inability to eat as evidenced by NPO status; improved with diet advancement  GOAL:  Patient will meet greater than or equal to 90% of their needs; ongoing  MONITOR:   (Energy Intake, Anthropometrics)  ASSESSMENT:  Pt POD3 ORIF of left hip. Pt scheduled for discharge today.  Pt sitting up in chair drinking chocolate milk on visit.  Current Nutrition: Pt reports eating soup with green beans at lunch today. Pt reports eating well at breakfast this am. Recorded po intake 100% of both meals today. PO intake 40-60% of meals yesterday.  Gastrointestinal Profile: loose BMs times 3 this am  Medications: remeron, senokot, ferrous sulfate  Electrolyte/Renal Profile and glucose Profile:  Recent Labs Lab 11/30/14 0629 12/01/14 0315 12/02/14 0332  NA 141 133* 138  K 3.6 3.2* 3.5  CL 106 104 107  CO2 26 24 24   BUN 21* 22* 20  CREATININE 1.42* 1.13* 0.96  CALCIUM 9.0 6.7* 7.3*  GLUCOSE 128* 109* 115*   Protein Profile:  Recent Labs Lab 11/30/14 0629  ALBUMIN 3.1*   Anthropometrics: weight since admission Filed Weights   11/30/14 0614  Weight: 89 lb 15.2 oz (40.801 kg)   Nutrition-Focused Physical Exam:  Unable to complete Nutrition-Focused physical exam at this time.   BMI:  Body mass index is 17.57 kg/(m^2).  Estimated Nutritional Needs:  Kcal:  BEE: 806kcals, TEE: (IF 1.1-1.3)(AF 1.2) 2248-2500BBCWU  Protein:  41-49g protein (1.0-1.2g/kg)  Fluid:  1013-1239mL of fluid (25-18mL/kg)  Skin:   reviewed, no issues  Diet Order:  Diet regular Room service appropriate?: Yes; Fluid consistency:: Thin Diet - low sodium heart healthy  EDUCATION NEEDS:  Education needs no  appropriate at this time   Intake/Output Summary (Last 24 hours) at 12/03/14 1456 Last data filed at 12/03/14 1343  Gross per 24 hour  Intake    360 ml  Output    895 ml  Net   -535 ml    MODERATE Care Level  Leda Quail, RD, LDN Pager [phone]

## 2014-12-03 NOTE — Clinical Social Work Placement (Signed)
   CLINICAL SOCIAL WORK PLACEMENT  NOTE  Date:  12/03/2014  Patient Details  Name: Allison Bowman MRN: [id number] Date of Birth: [birth year]  Clinical Social Work is seeking post-discharge placement for this patient at the Skilled  Nursing Facility level of care (*CSW will initial, date and re-position this form in  chart as items are completed):  Yes   Patient/family provided with [place] Clinical Social Work Department's list of facilities offering this level of care within the geographic area requested by the patient (or if unable, by the patient's family).  Yes   Patient/family informed of their freedom to choose among providers that offer the needed level of care, that participate in Medicare, Medicaid or managed care program needed by the patient, have an available bed and are willing to accept the patient.  Yes   Patient/family informed of [place]'s ownership interest in [facility] and [facility], as well as of the fact that they are under no obligation to receive care at these facilities.  PASRR submitted to EDS on 11/30/14     PASRR number received on 11/30/14     Existing PASRR number confirmed on       FL2 transmitted to all facilities in geographic area requested by pt/family on 11/30/14     FL2 transmitted to all facilities within larger geographic area on       Patient informed that his/her managed care company has contracts with or will negotiate with certain facilities, including the following:        Yes   Patient/family informed of bed offers received.  Patient chooses bed at       Physician recommends and patient chooses bed at      Patient to be transferred to   on  .  Patient to be transferred to facility by       Patient family notified on   of transfer.  Name of family member notified:        PHYSICIAN       Additional Comment:    _______________________________________________ Haig Prophet, LCSW 12/03/2014, 10:49 AM

## 2014-12-03 NOTE — Progress Notes (Signed)
Physical Therapy Treatment Patient Details Name: Allison Bowman MRN: [id number] DOB: [birth year] Today's Date: 12/03/2014    History of Present Illness fall with L hip fx and ORIF    PT Comments    Pt performs exercises, transfer and short ambulation to chair with moderate difficulty, but participates well. Pt comfortable up in chair. Encouraged continued work on [employer], glut sets and ankle pumps while up in chair, but pt needs reinforcement/cueing for performance. Plan to see pt this pm if not discharged to skilled nursing facility. Pt has a very small stature and would benefit from a youth rolling walker versus a standard one to improve ease and confidence with ambulation and compliance with PWB status on the left.  Follow Up Recommendations  SNF     Equipment Recommendations   (youth rolling walker)    Recommendations for Other Services       Precautions / Restrictions Restrictions Weight Bearing Restrictions: Yes LLE Weight Bearing: Partial weight bearing    Mobility  Bed Mobility Overal bed mobility: Needs Assistance       Supine to sit: Mod assist     General bed mobility comments: Requires sequence cueing; Max A for LEs; Mod A for trunk and scooting  Transfers Overall transfer level: Needs assistance Equipment used: Rolling walker (2 wheeled) Transfers: Sit to/from Stand Sit to Stand: Min assist         General transfer comment:  (Cueing for hands; pt needs youth rw for correct height)  Ambulation/Gait Ambulation/Gait assistance: Min assist Ambulation Distance (Feet): 5 Feet Assistive device: Rolling walker (2 wheeled) (Needs youth rw)       General Gait Details: Requires cueing for sequencing and to initiate steps, as pt hesitates with each step; compliant with L PWB due to pain. Would more than likely perform better wiith youth rw, pt very small statured and attempts to hold lower bars on rw.    Stairs            Wheelchair Mobility     Modified Rankin (Stroke Patients Only)       Balance                                    Cognition Arousal/Alertness: Awake/alert Behavior During Therapy: WFL for tasks assessed/performed Overall Cognitive Status: History of cognitive impairments - at baseline                      Exercises General Exercises - Lower Extremity Ankle Circles/Pumps: AROM;20 reps;Supine;Both Quad Sets: Strengthening;Both;15 reps;Supine Gluteal Sets: Strengthening;Both;15 reps;Supine (inconsistent technique) Short Arc Quad: AROM;Both;15 reps;Supine Heel Slides: AAROM;Both;15 reps;Supine Hip ABduction/ADduction: AAROM;Both;15 reps;Supine Straight Leg Raises: AAROM;Both;10 reps;Supine    General Comments        Pertinent Vitals/Pain      Home Living                      Prior Function            PT Goals (current goals can now be found in the care plan section) Progress towards PT goals: Progressing toward goals (slowly)    Frequency  BID    PT Plan Current plan remains appropriate    Co-evaluation             End of Session Equipment Utilized During Treatment: Gait belt Activity Tolerance: Patient limited by fatigue;Patient limited by pain Patient  left: in chair;with call bell/phone within reach;with chair alarm set     Time: [phone] PT Time Calculation (min) (ACUTE ONLY): 36 min  Charges:  $Gait Training: 8-22 mins $Therapeutic Exercise: 8-22 mins                    G Codes:      Kristeen Miss 12/03/2014, 10:25 AM

## 2014-12-03 NOTE — Discharge Summary (Addendum)
[facility] [place] [facility]   PATIENT NAME: Allison Bowman    MR#:  [id number]  DATE OF BIRTH:  04-Jan-1934  DATE OF ADMISSION:  11/30/2014 ADMITTING PHYSICIAN: Katharina Caper, MD  DATE OF DISCHARGE: 12/03/2014  PRIMARY CARE PHYSICIAN: No primary care provider on file.    ADMISSION DIAGNOSIS:  Deformity [Q89.9] Left leg pain [M79.605] Left hip pain [M25.552] Intertrochanteric fracture of left femur, closed, initial encounter [S72.142A]  DISCHARGE DIAGNOSIS:  Principal Problem:   Closed left hip fracture Active Problems:   HTN (hypertension)   Hip fracture   Weight loss   Gastroesophageal reflux disease without esophagitis   Stroke   COPD (chronic obstructive pulmonary disease)   Urinary retention  SECONDARY DIAGNOSIS:   Past Medical History  Diagnosis Date  . Dementia   . HTN (hypertension)    HOSPITAL COURSE:  Agent is a 81 year old female with above-mentioned medical problem was admitted for intertrochanteric hip fracture.  See Dr. Arlys John history and physical for further details.  Orthopedic consultation was obtained with Dr. Deeann Saint who recommended surgery.  This was performed on 17th of June 2016 without any immediate complication.  Patient has been working with physical therapy and is doing well.   1. Closed left hip intratrochanteric fracture, status post trochanteric femoral nail placement on 11/30/2014 by Dr. Deeann Saint.  2. Renal insufficiency, likely due to obstruction by her urologist, status post suprapubic catheter placement by interventional radiologist on 11/30/2014.  3. Dysuria, suspected UTI. Initially, but UA was unremarkable, very likely patient's dysuria is related to labial fusion and difficulty voiding, which she admits 4. History of essential hypertension . Patient is not hypertensive at present. We'll follow patient's blood pressure readings very closely , stable at present 5. Cardiac murmurs.  Echocardiogram showed normal ejection fraction, no regional wall motion abnormalities, aortic valve revealed moderate stenosis and mitral valve showed calcified annulus, stable and no CHF signs 6. COPD continue oxygen and inhalation therapy as needed  7. Urinary retention due to complete labial fusion, urologist recommends Clobetazol twice daily as well as her Estrogen at bedtime, trying to open patient's labial adhesions, follow up with urology in the next few weeks after discharge. Patient is to continue suprapubic catheter for the next few weeks until she seen by urologist.  8. Acute posthemorrhagic anemia. Transfuse 1 unit of blood in the hospital.  stable hemodynamics since then 9. Hypotension. Resolved after IV fluids and transfusion 10. Mild dyspnea. Chest x-ray showed no CHF , but may be slight atelectasis which is monitored right than on the left. Continue incentive spirometry.  Patient is agreeable with the discharge plan and is being discharged to a rehabilitation facility in stable condition.  DISCHARGE CONDITIONS:   stable  CONSULTS OBTAINED:  Treatment Team:  Deeann Saint, MD Vanna Scotland, MD  DRUG ALLERGIES:   Allergies  Allergen Reactions  . Lovastatin Shortness Of Breath   DISCHARGE MEDICATIONS:   Current Discharge Medication List    START taking these medications   Details  acetaminophen (TYLENOL) 325 MG tablet Take 2 tablets (650 mg total) by mouth every 6 (six) hours as needed for mild pain (or Fever >/= 101). Qty: 15 tablet, Refills: 0    alum & mag hydroxide-simeth (MAALOX/MYLANTA) 200-200-20 MG/5ML suspension Take 30 mLs by mouth every 4 (four) hours as needed for indigestion. Qty: 355 mL, Refills: 0    senna (SENOKOT) 8.6 MG TABS tablet Take 1 tablet (8.6 mg total) by mouth 2 (two) times  daily. Qty: 120 each, Refills: 0    traMADol (ULTRAM) 50 MG tablet Take 1 tablet (50 mg total) by mouth every 6 (six) hours as needed for moderate pain. Qty:  10 tablet, Refills: 0      CONTINUE these medications which have NOT CHANGED   Details  aspirin 325 MG tablet Take 325 mg by mouth daily.    atenolol (TENORMIN) 25 MG tablet Take 25 mg by mouth daily.    ferrous sulfate 325 (65 FE) MG tablet Take 325 mg by mouth daily with breakfast.    hydrochlorothiazide (MICROZIDE) 12.5 MG capsule Take 12.5 mg by mouth daily.    lisinopril (PRINIVIL,ZESTRIL) 5 MG tablet Take 5 mg by mouth daily.    memantine (NAMENDA) 5 MG tablet Take 5 mg by mouth 2 (two) times daily.    mirtazapine (REMERON) 30 MG tablet Take 30 mg by mouth at bedtime.       DISCHARGE INSTRUCTIONS:    DIET:  Cardiac diet  DISCHARGE CONDITION:  Good  ACTIVITY:  Activity as tolerated  OXYGEN:  Home Oxygen: No.   Oxygen Delivery: room air  DISCHARGE LOCATION:  nursing home   If you experience worsening of your admission symptoms, develop shortness of breath, life threatening emergency, suicidal or homicidal thoughts you must seek medical attention immediately by calling 911 or calling your MD immediately  if symptoms less severe.  You Must read complete instructions/literature along with all the possible adverse reactions/side effects for all the Medicines you take and that have been prescribed to you. Take any new Medicines after you have completely understood and accpet all the possible adverse reactions/side effects.   Please note  You were cared for by a hospitalist during your hospital stay. If you have any questions about your discharge medications or the care you received while you were in the hospital after you are discharged, you can call the unit and asked to speak with the hospitalist on call if the hospitalist that took care of you is not available. Once you are discharged, your primary care physician will handle any further medical issues. Please note that NO REFILLS for any discharge medications will be authorized once you are discharged, as it is  imperative that you return to your primary care physician (or establish a relationship with a primary care physician if you do not have one) for your aftercare needs so that they can reassess your need for medications and monitor your lab values.   On the day of Discharge:   VITAL SIGNS:  Blood pressure 137/72, pulse 96, temperature 98.7 F (37.1 C), temperature source Oral, resp. rate 18, height 5' (1.524 m), weight 40.801 kg (89 lb 15.2 oz), SpO2 96 %.  I/O:   Intake/Output Summary (Last 24 hours) at 12/03/14 1213 Last data filed at 12/03/14 0900  Gross per 24 hour  Intake    240 ml  Output    745 ml  Net   -505 ml    PHYSICAL EXAMINATION:  GENERAL:  81 y.o.-year-old patient lying in the bed with no acute distress.  EYES: Pupils equal, round, reactive to light and accommodation. No scleral icterus. Extraocular muscles intact.  HEENT: Head atraumatic, normocephalic. Oropharynx and nasopharynx clear.  NECK:  Supple, no jugular venous distention. No thyroid enlargement, no tenderness.  LUNGS: Normal breath sounds bilaterally, no wheezing, rales,rhonchi or crepitation. No use of accessory muscles of respiration.  CARDIOVASCULAR: S1, S2 normal. No murmurs, rubs, or gallops.  ABDOMEN: Soft, non-tender, non-distended. Bowel  sounds present. No organomegaly or mass.  EXTREMITIES: No pedal edema, cyanosis, or clubbing.  NEUROLOGIC: Cranial nerves II through XII are intact. Muscle strength 5/5 in all extremities. Sensation intact. Gait not checked.  PSYCHIATRIC: The patient is alert and oriented x 3.  SKIN: No obvious rash, lesion, or ulcer.   DATA REVIEW:   CBC  Recent Labs Lab 12/03/14 0349  WBC 9.6  HGB 9.9*  HCT 29.4*  PLT 256    Chemistries   Recent Labs Lab 11/30/14 0629  12/02/14 0332  NA 141  < > 138  K 3.6  < > 3.5  CL 106  < > 107  CO2 26  < > 24  GLUCOSE 128*  < > 115*  BUN 21*  < > 20  CREATININE 1.42*  < > 0.96  CALCIUM 9.0  < > 7.3*  AST 24  --   --    ALT 13*  --   --   ALKPHOS 98  --   --   BILITOT 0.7  --   --   < > = values in this interval not displayed.  RADIOLOGY:  Dg Chest Port 1 View  12/01/2014   CLINICAL DATA:  New onset shortness of breath, 1 day after surgery for hip fracture repair  EXAM: PORTABLE CHEST - 1 VIEW  COMPARISON:  November 30, 2014  FINDINGS: There is slight bibasilar atelectasis. No frank edema or consolidation. Heart is borderline prominent with pulmonary vascularity within normal limits. No adenopathy. There are foci of atherosclerotic calcification in the aorta and axillary arteries.  IMPRESSION: Mild bibasilar atelectasis, slightly more on the right than on the left. No frank edema or consolidation. Heart mildly prominent but stable.   Electronically Signed   By: Bretta Bang III M.D.   On: 12/01/2014 14:07   Management plans discussed with the patient, family and they are in agreement.  CODE STATUS: Full Code  TOTAL TIME TAKING CARE OF THIS PATIENT: 55 minutes.    [facility], Denise Washburn M.D on 12/03/2014 at 12:13 PM  Between 7am to 6pm - Pager - [phone]  After 6pm go to [email or web address] - password [email or web address] [facility]  [facility]  Office  [phone]  CC: Primary care physician; No primary care provider on file. Deeann Saint, MD Vanna Scotland, MD

## 2014-12-03 NOTE — Progress Notes (Signed)
Subjective: 3 Days Post-Op Procedure(s) (LRB): INTRAMEDULLARY (IM) NAIL INTERTROCHANTRIC Left short  (Left)    Patient reports pain as mild. Doing well.  csm good.  Dressing dry.  For SNF today.  RTC 2 weeks   Objective:   VITALS:   Filed Vitals:   12/03/14 0936  BP:   Pulse: 96  Temp:   Resp:     Neurovascular intact Sensation intact distally Intact pulses distally Dorsiflexion/Plantar flexion intact  LABS  Recent Labs  12/01/14 1517 12/02/14 0332 12/03/14 0349  HGB 10.0* 9.8* 9.9*  HCT 31.2* 31.6* 29.4*  WBC 8.9 8.0 9.6  PLT 250 252 256     Recent Labs  12/01/14 0315 12/02/14 0332  NA 133* 138  K 3.2* 3.5  BUN 22* 20  CREATININE 1.13* 0.96  GLUCOSE 109* 115*    No results for input(s): LABPT, INR in the last 72 hours.   Assessment/Plan: 3 Days Post-Op Procedure(s) (LRB): INTRAMEDULLARY (IM) NAIL INTERTROCHANTRIC Left short  (Left)   Discharge to SNF  RTC 2 weeks

## 2014-12-03 NOTE — Progress Notes (Signed)
Called report to [facility] spoke with Etheleen Mayhew, LPN.  Called report to EMS for transport.  No complaints at this time.

## 2014-12-04 DIAGNOSIS — R12 Heartburn: Secondary | ICD-10-CM | POA: Diagnosis not present

## 2014-12-04 DIAGNOSIS — Q525 Fusion of labia: Secondary | ICD-10-CM | POA: Diagnosis not present

## 2014-12-04 DIAGNOSIS — K5900 Constipation, unspecified: Secondary | ICD-10-CM | POA: Diagnosis not present

## 2014-12-04 DIAGNOSIS — D649 Anemia, unspecified: Secondary | ICD-10-CM | POA: Diagnosis not present

## 2014-12-04 DIAGNOSIS — F0390 Unspecified dementia without behavioral disturbance: Secondary | ICD-10-CM | POA: Diagnosis not present

## 2014-12-04 DIAGNOSIS — S72142D Displaced intertrochanteric fracture of left femur, subsequent encounter for closed fracture with routine healing: Secondary | ICD-10-CM | POA: Diagnosis not present

## 2014-12-04 DIAGNOSIS — R630 Anorexia: Secondary | ICD-10-CM | POA: Diagnosis not present

## 2014-12-04 DIAGNOSIS — R339 Retention of urine, unspecified: Secondary | ICD-10-CM | POA: Diagnosis not present

## 2014-12-04 DIAGNOSIS — I10 Essential (primary) hypertension: Secondary | ICD-10-CM | POA: Diagnosis not present

## 2014-12-05 DIAGNOSIS — R339 Retention of urine, unspecified: Secondary | ICD-10-CM | POA: Diagnosis not present

## 2014-12-05 DIAGNOSIS — R630 Anorexia: Secondary | ICD-10-CM | POA: Diagnosis not present

## 2014-12-05 DIAGNOSIS — R12 Heartburn: Secondary | ICD-10-CM | POA: Diagnosis not present

## 2014-12-05 DIAGNOSIS — Q525 Fusion of labia: Secondary | ICD-10-CM | POA: Diagnosis not present

## 2014-12-05 DIAGNOSIS — D649 Anemia, unspecified: Secondary | ICD-10-CM | POA: Diagnosis not present

## 2014-12-05 DIAGNOSIS — K5900 Constipation, unspecified: Secondary | ICD-10-CM | POA: Diagnosis not present

## 2014-12-05 DIAGNOSIS — S72142D Displaced intertrochanteric fracture of left femur, subsequent encounter for closed fracture with routine healing: Secondary | ICD-10-CM | POA: Diagnosis not present

## 2014-12-05 DIAGNOSIS — I10 Essential (primary) hypertension: Secondary | ICD-10-CM | POA: Diagnosis not present

## 2014-12-05 DIAGNOSIS — F0390 Unspecified dementia without behavioral disturbance: Secondary | ICD-10-CM | POA: Diagnosis not present

## 2014-12-08 DIAGNOSIS — R339 Retention of urine, unspecified: Secondary | ICD-10-CM | POA: Diagnosis not present

## 2014-12-08 DIAGNOSIS — Z466 Encounter for fitting and adjustment of urinary device: Secondary | ICD-10-CM

## 2014-12-08 DIAGNOSIS — Q525 Fusion of labia: Secondary | ICD-10-CM

## 2014-12-08 DIAGNOSIS — N179 Acute kidney failure, unspecified: Secondary | ICD-10-CM

## 2014-12-14 DIAGNOSIS — D649 Anemia, unspecified: Secondary | ICD-10-CM | POA: Diagnosis not present

## 2014-12-14 DIAGNOSIS — I10 Essential (primary) hypertension: Secondary | ICD-10-CM | POA: Diagnosis not present

## 2014-12-14 DIAGNOSIS — F0390 Unspecified dementia without behavioral disturbance: Secondary | ICD-10-CM | POA: Diagnosis not present

## 2014-12-14 DIAGNOSIS — K5900 Constipation, unspecified: Secondary | ICD-10-CM | POA: Diagnosis not present

## 2014-12-14 DIAGNOSIS — R339 Retention of urine, unspecified: Secondary | ICD-10-CM | POA: Diagnosis not present

## 2014-12-14 DIAGNOSIS — S72142D Displaced intertrochanteric fracture of left femur, subsequent encounter for closed fracture with routine healing: Secondary | ICD-10-CM | POA: Diagnosis not present

## 2014-12-14 DIAGNOSIS — R630 Anorexia: Secondary | ICD-10-CM | POA: Diagnosis not present

## 2014-12-14 DIAGNOSIS — Q525 Fusion of labia: Secondary | ICD-10-CM | POA: Diagnosis not present

## 2014-12-14 DIAGNOSIS — R12 Heartburn: Secondary | ICD-10-CM | POA: Diagnosis not present

## 2014-12-17 DIAGNOSIS — S72002D Fracture of unspecified part of neck of left femur, subsequent encounter for closed fracture with routine healing: Secondary | ICD-10-CM | POA: Diagnosis not present

## 2014-12-17 DIAGNOSIS — E46 Unspecified protein-calorie malnutrition: Secondary | ICD-10-CM | POA: Diagnosis not present

## 2014-12-17 DIAGNOSIS — I10 Essential (primary) hypertension: Secondary | ICD-10-CM | POA: Diagnosis not present

## 2014-12-17 DIAGNOSIS — D649 Anemia, unspecified: Secondary | ICD-10-CM | POA: Diagnosis not present

## 2014-12-21 DIAGNOSIS — E46 Unspecified protein-calorie malnutrition: Secondary | ICD-10-CM | POA: Diagnosis not present

## 2014-12-21 DIAGNOSIS — D649 Anemia, unspecified: Secondary | ICD-10-CM | POA: Diagnosis not present

## 2014-12-21 DIAGNOSIS — I10 Essential (primary) hypertension: Secondary | ICD-10-CM | POA: Diagnosis not present

## 2014-12-21 DIAGNOSIS — S72002D Fracture of unspecified part of neck of left femur, subsequent encounter for closed fracture with routine healing: Secondary | ICD-10-CM | POA: Diagnosis not present

## 2014-12-26 DIAGNOSIS — R011 Cardiac murmur, unspecified: Secondary | ICD-10-CM

## 2014-12-26 DIAGNOSIS — M1990 Unspecified osteoarthritis, unspecified site: Secondary | ICD-10-CM | POA: Insufficient documentation

## 2014-12-26 DIAGNOSIS — F0390 Unspecified dementia without behavioral disturbance: Secondary | ICD-10-CM

## 2014-12-26 DIAGNOSIS — E039 Hypothyroidism, unspecified: Secondary | ICD-10-CM

## 2014-12-26 DIAGNOSIS — R7301 Impaired fasting glucose: Secondary | ICD-10-CM

## 2014-12-26 DIAGNOSIS — G894 Chronic pain syndrome: Secondary | ICD-10-CM

## 2014-12-26 DIAGNOSIS — D649 Anemia, unspecified: Secondary | ICD-10-CM | POA: Insufficient documentation

## 2014-12-26 DIAGNOSIS — I639 Cerebral infarction, unspecified: Secondary | ICD-10-CM

## 2014-12-31 ENCOUNTER — Encounter: Payer: Self-pay | Admitting: Unknown Physician Specialty

## 2014-12-31 ENCOUNTER — Ambulatory Visit (INDEPENDENT_AMBULATORY_CARE_PROVIDER_SITE_OTHER): Payer: Medicare Other | Admitting: Unknown Physician Specialty

## 2014-12-31 ENCOUNTER — Emergency Department
Admission: EM | Admit: 2014-12-31 | Discharge: 2014-12-31 | Disposition: A | Discharge status: Home / Self Care | Payer: Medicare Other | Attending: Emergency Medicine | Admitting: Emergency Medicine

## 2014-12-31 ENCOUNTER — Encounter: Payer: Self-pay | Admitting: Emergency Medicine

## 2014-12-31 VITALS — BP 100/59 | HR 85 | Temp 98.0°F | Ht <= 58 in | Wt 84.2 lb

## 2014-12-31 DIAGNOSIS — Z7982 Long term (current) use of aspirin: Secondary | ICD-10-CM | POA: Insufficient documentation

## 2014-12-31 DIAGNOSIS — T83010A Breakdown (mechanical) of cystostomy catheter, initial encounter: Secondary | ICD-10-CM

## 2014-12-31 DIAGNOSIS — F0390 Unspecified dementia without behavioral disturbance: Secondary | ICD-10-CM | POA: Diagnosis not present

## 2014-12-31 DIAGNOSIS — Y846 Urinary catheterization as the cause of abnormal reaction of the patient, or of later complication, without mention of misadventure at the time of the procedure: Secondary | ICD-10-CM | POA: Diagnosis not present

## 2014-12-31 DIAGNOSIS — T83098A Other mechanical complication of other indwelling urethral catheter, initial encounter: Secondary | ICD-10-CM | POA: Insufficient documentation

## 2014-12-31 DIAGNOSIS — Z9359 Other cystostomy status: Secondary | ICD-10-CM

## 2014-12-31 DIAGNOSIS — Z87891 Personal history of nicotine dependence: Secondary | ICD-10-CM | POA: Diagnosis not present

## 2014-12-31 DIAGNOSIS — Z79899 Other long term (current) drug therapy: Secondary | ICD-10-CM | POA: Insufficient documentation

## 2014-12-31 DIAGNOSIS — I10 Essential (primary) hypertension: Secondary | ICD-10-CM | POA: Diagnosis not present

## 2014-12-31 DIAGNOSIS — R339 Retention of urine, unspecified: Secondary | ICD-10-CM | POA: Diagnosis present

## 2014-12-31 DIAGNOSIS — L909 Atrophic disorder of skin, unspecified: Secondary | ICD-10-CM | POA: Diagnosis not present

## 2014-12-31 DIAGNOSIS — I639 Cerebral infarction, unspecified: Secondary | ICD-10-CM

## 2014-12-31 DIAGNOSIS — R238 Other skin changes: Secondary | ICD-10-CM

## 2014-12-31 MED ORDER — CEPHALEXIN 500 MG PO CAPS
500.0000 mg | ORAL_CAPSULE | Freq: Once | ORAL | Status: AC
  Administered 2014-12-31: 500 mg via ORAL
  Filled 2014-12-31: qty 1

## 2014-12-31 MED ORDER — DUODERM CGF EXTRA THIN EX MISC: 1.0000 | Freq: Every morning | CUTANEOUS | Status: DC

## 2014-12-31 MED ORDER — CEPHALEXIN 500 MG PO CAPS: 500.0000 mg | ORAL_CAPSULE | Freq: Four times a day (QID) | ORAL | Status: DC

## 2014-12-31 MED ORDER — LISINOPRIL 5 MG PO TABS: 5.0000 mg | ORAL_TABLET | Freq: Every day | ORAL | Status: DC

## 2014-12-31 NOTE — Assessment & Plan Note (Addendum)
Stop HCTZ due to SBP of 100.  I would like it a little higher.  Her caregiver will continue to monitor.

## 2014-12-31 NOTE — Progress Notes (Signed)
BP 100/59 mmHg  Pulse 85  Temp(Src) 98 F (36.7 C)  Ht 4' 5.2" (1.351 m)  Wt 84 lb 3.2 oz (38.193 kg)  BMI 20.93 kg/m2  SpO2 90%  LMP  (LMP Unknown)   Subjective:    Patient ID: Allison Bowman, female    DOB: 09-16-1933, 81 y.o.   MRN: [id number]  HPI: Allison Bowman is a 81 y.o. female  Chief Complaint  Patient presents with  . Hypertension  . Hyperlipidemia  . Medication Refill   Dementia Here with caregiver who states dementia is worse.  Larey Seat and broke her hip.  Now she has a suprapubic catheter managed by urology.  Taken off Donepezil by son due to excessive diarrhea.    Hypertension Low normal BP today with SBP of 100.  She needs refill of Lisinopril.    Skin breakdown Areas on buttocks without bed sore, but she does have some skin breakdown in the area.  Caregiver says she complains of pain there and sometimes it bleeds.     Relevant past medical, surgical, family and social history reviewed and updated as indicated. Interim medical history since our last visit reviewed. Allergies and medications reviewed and updated.  Review of Systems  Per HPI unless specifically indicated above     Objective:    BP 100/59 mmHg  Pulse 85  Temp(Src) 98 F (36.7 C)  Ht 4' 5.2" (1.351 m)  Wt 84 lb 3.2 oz (38.193 kg)  BMI 20.93 kg/m2  SpO2 90%  LMP  (LMP Unknown)  Wt Readings from Last 3 Encounters:  12/31/14 84 lb 3.2 oz (38.193 kg)  12/31/14 84 lb (38.102 kg)  09/21/14 89 lb (40.37 kg)    Physical Exam  Constitutional: She is oriented to person, place, and time. She appears well-developed and well-nourished. No distress.  HENT:  Head: Normocephalic and atraumatic.  Eyes: Conjunctivae and lids are normal. Right eye exhibits no discharge. Left eye exhibits no discharge. No scleral icterus.  Cardiovascular: Normal rate, regular rhythm and normal heart sounds.   Pulmonary/Chest: Effort normal and breath sounds normal. No respiratory distress.  Abdominal: Normal  appearance. There is no splenomegaly or hepatomegaly.  Musculoskeletal: Normal range of motion.  Neurological: She is alert and oriented to person, place, and time.  Skin: Skin is warm, dry and intact.  Area of breakdown upper buttocks.    Psychiatric: She has a normal mood and affect. Her behavior is normal. Judgment and thought content normal.    Results for orders placed or performed during the hospital encounter of 11/30/14  Surgical PCR screen  Result Value Ref Range   MRSA, PCR NEGATIVE NEGATIVE   Staphylococcus aureus NEGATIVE NEGATIVE  CBC  Result Value Ref Range   WBC 9.3 3.6 - 11.0 K/uL   RBC 4.12 3.80 - 5.20 MIL/uL   Hemoglobin 11.1 (L) 12.0 - 16.0 g/dL   HCT 46.9 62.9 - 52.8 %   MCV 87.5 80.0 - 100.0 fL   MCH 27.0 26.0 - 34.0 pg   MCHC 30.8 (L) 32.0 - 36.0 g/dL   RDW 41.3 (H) 24.4 - 01.0 %   Platelets 302 150 - 440 K/uL  Comprehensive metabolic panel  Result Value Ref Range   Sodium 141 135 - 145 mmol/L   Potassium 3.6 3.5 - 5.1 mmol/L   Chloride 106 101 - 111 mmol/L   CO2 26 22 - 32 mmol/L   Glucose, Bld 128 (H) 65 - 99 mg/dL   BUN 21 (H) 6 -  20 mg/dL   Creatinine, Ser 1.611.42 (H) 0.44 - 1.00 mg/dL   Calcium 9.0 8.9 - 09.610.3 mg/dL   Total Protein 6.4 (L) 6.5 - 8.1 g/dL   Albumin 3.1 (L) 3.5 - 5.0 g/dL   AST 24 15 - 41 U/L   ALT 13 (L) 14 - 54 U/L   Alkaline Phosphatase 98 38 - 126 U/L   Total Bilirubin 0.7 0.3 - 1.2 mg/dL   GFR calc non Af Amer 34 (L) >60 mL/min   GFR calc Af Amer 40 (L) >60 mL/min   Anion gap 9 5 - 15  Protime-INR  Result Value Ref Range   Prothrombin Time 12.6 11.4 - 15.0 seconds   INR 0.92   TSH  Result Value Ref Range   TSH 7.334 (H) 0.350 - 4.500 uIU/mL  APTT  Result Value Ref Range   aPTT 24 24 - 36 seconds  Vit D  25 hydroxy (rtn osteoporosis monitoring)  Result Value Ref Range   Vit D, 25-Hydroxy 17.8 (L) 30.0 - 100.0 ng/mL  Hemoglobin A1c  Result Value Ref Range   Hgb A1c MFr Bld 5.9 4.0 - 6.0 %  Urinalysis complete, with  microscopic ([facility] only)  Result Value Ref Range   Color, Urine YELLOW (A) YELLOW   APPearance CLEAR (A) CLEAR   Glucose, UA NEGATIVE NEGATIVE mg/dL   Bilirubin Urine NEGATIVE NEGATIVE   Ketones, ur NEGATIVE NEGATIVE mg/dL   Specific Gravity, Urine 1.012 1.005 - 1.030   Hgb urine dipstick 2+ (A) NEGATIVE   pH 5.0 5.0 - 8.0   Protein, ur NEGATIVE NEGATIVE mg/dL   Nitrite NEGATIVE NEGATIVE   Leukocytes, UA 1+ (A) NEGATIVE   RBC / HPF 0-5 0 - 5 RBC/hpf   WBC, UA 0-5 0 - 5 WBC/hpf   Bacteria, UA NONE SEEN NONE SEEN   Squamous Epithelial / LPF 0-5 (A) NONE SEEN   Trans Epithel, UA <1    Mucous PRESENT   Basic metabolic panel  Result Value Ref Range   Sodium 133 (L) 135 - 145 mmol/L   Potassium 3.2 (L) 3.5 - 5.1 mmol/L   Chloride 104 101 - 111 mmol/L   CO2 24 22 - 32 mmol/L   Glucose, Bld 109 (H) 65 - 99 mg/dL   BUN 22 (H) 6 - 20 mg/dL   Creatinine, Ser 0.451.13 (H) 0.44 - 1.00 mg/dL   Calcium 6.7 (L) 8.9 - 10.3 mg/dL   GFR calc non Af Amer 45 (L) >60 mL/min   GFR calc Af Amer 53 (L) >60 mL/min   Anion gap 5 5 - 15  Hemoglobin  Result Value Ref Range   Hemoglobin 8.0 (L) 12.0 - 16.0 g/dL  TSH  Result Value Ref Range   TSH 2.505 0.350 - 4.500 uIU/mL  CBC  Result Value Ref Range   WBC 8.9 3.6 - 11.0 K/uL   RBC 3.74 (L) 3.80 - 5.20 MIL/uL   Hemoglobin 10.0 (L) 12.0 - 16.0 g/dL   HCT 40.931.2 (L) 81.135.0 - 91.447.0 %   MCV 83.6 80.0 - 100.0 fL   MCH 26.8 26.0 - 34.0 pg   MCHC 32.0 32.0 - 36.0 g/dL   RDW 78.215.7 (H) 95.611.5 - 21.314.5 %   Platelets 250 150 - 440 K/uL  CBC  Result Value Ref Range   WBC 8.0 3.6 - 11.0 K/uL   RBC 3.77 (L) 3.80 - 5.20 MIL/uL   Hemoglobin 9.8 (L) 12.0 - 16.0 g/dL   HCT 08.631.6 (L) 57.835.0 -  47.0 %   MCV 83.8 80.0 - 100.0 fL   MCH 26.0 26.0 - 34.0 pg   MCHC 31.0 (L) 32.0 - 36.0 g/dL   RDW 16.1 (H) 09.6 - 04.5 %   Platelets 252 150 - 440 K/uL  Basic metabolic panel  Result Value Ref Range   Sodium 138 135 - 145 mmol/L   Potassium 3.5 3.5 - 5.1 mmol/L   Chloride 107 101  - 111 mmol/L   CO2 24 22 - 32 mmol/L   Glucose, Bld 115 (H) 65 - 99 mg/dL   BUN 20 6 - 20 mg/dL   Creatinine, Ser 4.09 0.44 - 1.00 mg/dL   Calcium 7.3 (L) 8.9 - 10.3 mg/dL   GFR calc non Af Amer 55 (L) >60 mL/min   GFR calc Af Amer >60 >60 mL/min   Anion gap 7 5 - 15  CBC  Result Value Ref Range   WBC 9.6 3.6 - 11.0 K/uL   RBC 3.56 (L) 3.80 - 5.20 MIL/uL   Hemoglobin 9.9 (L) 12.0 - 16.0 g/dL   HCT 81.1 (L) 91.4 - 78.2 %   MCV 82.8 80.0 - 100.0 fL   MCH 27.7 26.0 - 34.0 pg   MCHC 33.5 32.0 - 36.0 g/dL   RDW 95.6 (H) 21.3 - 08.6 %   Platelets 256 150 - 440 K/uL  Type and screen  Result Value Ref Range   ABO/RH(D) O POS    Antibody Screen NEG    Sample Expiration 12/03/2014    Unit Number [id number]    Blood Component Type RCLI PHER 1    Unit division 00    Status of Unit ISSUED,FINAL    Transfusion Status OK TO TRANSFUSE    Crossmatch Result Compatible   ABO/Rh  Result Value Ref Range   ABO/RH(D) O POS   Prepare RBC (crossmatch)  Result Value Ref Range   Order Confirmation ORDER PROCESSED BY BLOOD BANK       Assessment & Plan:   Problem List Items Addressed This Visit      Cardiovascular and Mediastinum   HTN (hypertension)    Stop HCTZ due to SBP of 100.  I would like it a little higher.  Her caregiver will continue to monitor.        Relevant Medications   lisinopril (PRINIVIL,ZESTRIL) 5 MG tablet     Nervous and Auditory   Dementia - Primary    Stay off Aricept.  Continue with Namenda.  Discussed trajectory of illness       Other Visit Diagnoses    Skin breakdown        rx for duoderm        Follow up plan: Return in about 3 months (around 04/02/2015).

## 2014-12-31 NOTE — ED Provider Notes (Signed)
[facility] Emergency Department Provider Note  ____________________________________________  Time seen: Approximately 8:10 AM  I have reviewed the triage vital signs and the nursing notes.   HISTORY  Chief Complaint Urinary Retention    HPI Allison Bowman is a 81 y.o. female had a recent hip surgery, and afterwards had complications with urinary retention. She required placement of a suprapubic catheter and urology. She and family come in today as stated noticing there was a slight amount of urine is a believe was leaking around her catheter site this morning, in addition there is a slight amount of redness at the catheter site. The patient denies being in any pain or having any fevers. She states she feels well at rehabilitation is doing well, she is using a walker and currently living at home.  No nausea or vomiting. No fever. Denies being any abdominal pain. She does not have an urge to urinate. She does not feel as though her bladder is "full". They did drain her catheter bag this morning and it has filled again with about 300 mL's of clear yellow urine at this time.   Past Medical History  Diagnosis Date  . Dementia   . HTN (hypertension)   . Arthritis   . Stroke   . GERD (gastroesophageal reflux disease)   . IFG (impaired fasting glucose)   . COPD (chronic obstructive pulmonary disease)   . Osteoporosis   . Anemia   . Palpitations   . Hearing loss   . Scoliosis     Patient Active Problem List   Diagnosis Date Noted  . Dementia 12/26/2014  . Anemia 12/26/2014  . IFG (impaired fasting glucose) 12/26/2014  . Chronic pain syndrome 12/26/2014  . Heart murmur 12/26/2014  . CVA (cerebral infarction) 12/26/2014  . Hypothyroidism 12/26/2014  . Osteoarthritis 12/26/2014  . HTN (hypertension) 11/30/2014  . Closed left hip fracture 11/30/2014  . Hip fracture 11/30/2014  . Weight loss 11/30/2014  . Gastroesophageal reflux disease without  esophagitis 11/30/2014  . Stroke 11/30/2014  . COPD (chronic obstructive pulmonary disease) 11/30/2014  . Urinary retention     Past Surgical History  Procedure Laterality Date  . Intramedullary (im) nail intertrochanteric Left 11/30/2014    Procedure: INTRAMEDULLARY (IM) NAIL INTERTROCHANTRIC Left short ;  Surgeon: Deeann Saint, MD;  Location: [facility] ORS;  Service: Orthopedics;  Laterality: Left;    Current Outpatient Rx  Name  Route  Sig  Dispense  Refill  . acetaminophen (TYLENOL) 325 MG tablet   Oral   Take 2 tablets (650 mg total) by mouth every 6 (six) hours as needed for mild pain (or Fever >/= 101).   15 tablet   0   . alum & mag hydroxide-simeth (MAALOX/MYLANTA) 200-200-20 MG/5ML suspension   Oral   Take 30 mLs by mouth every 4 (four) hours as needed for indigestion.   355 mL   0   . aspirin EC 325 MG tablet   Oral   Take 325 mg by mouth daily.         Marland Kitchen atenolol (TENORMIN) 25 MG tablet   Oral   Take 25 mg by mouth daily.         . cephALEXin (KEFLEX) 500 MG capsule   Oral   Take 1 capsule (500 mg total) by mouth 4 (four) times daily.   40 capsule   0   . clobetasol cream (TEMOVATE) 0.05 %   Topical   Apply topically 2 (two) times daily.  30 g   0   . conjugated estrogens (PREMARIN) vaginal cream   Vaginal   Place 1 Applicatorful vaginally at bedtime.   42.5 g   12   . donepezil (ARICEPT) 5 MG tablet   Oral   Take 5 mg by mouth at bedtime.         . ferrous sulfate 325 (65 FE) MG tablet   Oral   Take 325 mg by mouth daily.          . hydrochlorothiazide (HYDRODIURIL) 25 MG tablet   Oral   Take 25 mg by mouth daily.         Marland Kitchen lisinopril (PRINIVIL,ZESTRIL) 5 MG tablet   Oral   Take 5 mg by mouth daily.         . memantine (NAMENDA) 5 MG tablet   Oral   Take 5 mg by mouth 2 (two) times daily.         . mirtazapine (REMERON) 30 MG tablet   Oral   Take 30 mg by mouth at bedtime.         . senna (SENOKOT) 8.6 MG TABS  tablet   Oral   Take 1 tablet (8.6 mg total) by mouth 2 (two) times daily.   120 each   0   . traMADol (ULTRAM) 50 MG tablet   Oral   Take 1 tablet (50 mg total) by mouth every 6 (six) hours as needed for moderate pain.   10 tablet   0     Allergies Lovastatin  No family history on file.  Social History History  Substance Use Topics  . Smoking status: Former Games developer  . Smokeless tobacco: Never Used  . Alcohol Use: No    Review of Systems Constitutional: No fever/chills Eyes: No visual changes. ENT: No sore throat. Cardiovascular: Denies chest pain. Respiratory: Denies shortness of breath. Gastrointestinal: No abdominal pain.  No nausea, no vomiting.  No diarrhea.  No constipation. Genitourinary: Negative for dysuria. Musculoskeletal: Negative for back pain. Skin: Negative for rash. Neurological: Negative for headaches, focal weakness or numbness.  10-point ROS otherwise negative.  ____________________________________________   PHYSICAL EXAM:  VITAL SIGNS: ED Triage Vitals  Enc Vitals Group     BP 12/31/14 0742 131/63 mmHg     Pulse Rate 12/31/14 0742 88     Resp 12/31/14 0742 18     Temp 12/31/14 0742 98 F (36.7 C)     Temp Source 12/31/14 0742 Oral     SpO2 12/31/14 0742 96 %     Weight 12/31/14 0742 84 lb (38.102 kg)     Height 12/31/14 0742  (1.473 m)     Head Cir --      Peak Flow --      Pain Score --      Pain Loc --      Pain Edu? --      Excl. in GC? --     Constitutional: Alert and oriented. Well appearing and in no acute distress. Eyes: Conjunctivae are normal. PERRL. EOMI. Head: Atraumatic. Nose: No congestion/rhinnorhea. Mouth/Throat: Mucous membranes are moist.  Oropharynx non-erythematous. Neck: No stridor.   Cardiovascular: Normal rate, regular rhythm. Grossly normal heart sounds.  Good peripheral circulation. Respiratory: Normal respiratory effort.  No retractions. Lungs CTAB. Gastrointestinal: Soft and nontender. No  distention. No abdominal bruits. No CVA tenderness. Genitourinary: Suprapubic catheter is noted and appears to be in place and draining clear yellow urine. There is however a very  small area of erythema approximately 1 cm in size surrounding the site, and a very small amount of purulence draining. The catheter also appears to have backed out slightly, but is still held in by single suture. Musculoskeletal: No lower extremity tenderness nor edema.  No joint effusions. Neurologic:  Normal speech and language. No gross focal neurologic deficits are appreciated. No gait instability. Skin:  Skin is warm, dry and intact. No rash noted. Psychiatric: Mood and affect are normal. Speech and behavior are normal.  ____________________________________________   LABS (all labs ordered are listed, but only abnormal results are displayed)  Labs Reviewed  WOUND CULTURE   ____________________________________________  EKG   ____________________________________________  RADIOLOGY   ____________________________________________   PROCEDURES  Procedure(s) performed: None  Critical Care performed: No  ____________________________________________   INITIAL IMPRESSION / ASSESSMENT AND PLAN / ED COURSE  Pertinent labs & imaging results that were available during my care of the patient were reviewed by me and considered in my medical decision making (see chart for details).  Patient presents for evaluation of suprapubic catheter. I have paged urology to discuss. She has no signs or symptoms to suggest severe systemic infection, though I do suspect there is a small localized area of infection based upon a tiny amount of purulence noted at the insertion site. Abdomen itself is nontender.  ----------------------------------------- 8:20 AM on 12/31/2014 -----------------------------------------  Discussed with Dr. Jacquelyne BalintMcDermott of urology. He recommends that slight amount of erythema and what appears to  be possibly some mild purulence is not unusual after suprapubic catheter placement. Given that the catheter appears to be draining well today, he does recommend that we place the patient on Keflex for the next week and have them follow-up with urology in the clinic. Patient does have a appointment coming up with Dr. Apolinar JunesBrandon, however should her symptoms not be improving within 24-48 hours or other new concerns arise [facility] is also available to be seen them sooner should they call.  I discussed with the patient treatment recommendations, follow-up, and return precautions and she and daughter are agreeable. ____________________________________________   FINAL CLINICAL IMPRESSION(S) / ED DIAGNOSES  Final diagnoses:  Suprapubic catheter dysfunction, initial encounter  Suprapubic catheter   early skin infection at suprapubic catheter site without complication    Sharyn CreamerMark Quale, MD 12/31/14 [phone]

## 2014-12-31 NOTE — Discharge Instructions (Signed)
Suprapubic Catheter  Please follow-up with Dr. Apolinar Junes. I recommend calling the clinic to schedule a follow-up appointment within the next 2-3 days. Please return to the emergency room right away should you develop a fever, weakness, abdominal pain, feel you cannot empty her bladder or an urge to urinate, you notice increasing redness or pus draining from around the catheter site, or other new concerns arise.  Home Guide A suprapubic catheter is a rubber tube with a tiny balloon on the end. It is used to drain urine from the bladder. This catheter is put in your bladder through a small opening in the lower center part of your abdomen. Suprapubic refers to the area right above your pubic bone. The balloon on the end of the catheter is filled with germ-free (sterile) water. This keeps the catheter from slipping out. When the catheter is in place, your urine will drain into a collection bag. The bag can be put beside your bed at night or attached to your leg during the day. HOW TO CARE FOR YOUR CATHETER  Cleaning your skin  Clean the skin around the catheter opening every day.  Wash your hands with soap and water.  Clean the skin around the opening with a clean washcloth and soapy water. Do not pull on the tube.  Pat the area dry with a clean towel.  Your caregiver may want you to put a bandage (dressing) over the site. Do not use ointment on this area unless your caregiver tells you to. Cleaning the catheter  Ask your caregiver if you need to clean the catheter and how often.  Use only soap and water.  There may be crusts on the catheter. Put hydrogen peroxide on a cotton ball or gauze pad to remove any crust. Emptying the collection bag  You may have a large drainage bag to use at night and a smaller one for daytime. Empty the large bag every 8 hours. Empty the small bag when it is about  full.  Keep the drainage bag below the level of the catheter. This keeps urine from flowing  backwards.  Hold the bag over the toilet or another container. Release the valve (spigot) at the bottom of the bag. Do not touch the opening of the spigot. Do not let the opening touch the toilet or container.  Close the spigot tightly when the bag is empty. Cleaning the collection bag  Clean the bag every few days.  First, wash your hands.  Disconnect the tubing from the catheter. Replace the used bag with a new bag. Then you can clean the used one.  Empty the used bag completely. Rinse it out with warm water and soap or fill the bag with water and add 1 teaspoon of vinegar. Let it sit for about 30 minutes. Then drain.  The bag should be completely dry before storing it. Put it inside a plastic bag to keep it clean. Checking everything  Always make sure there are no kinks in the catheter or tubing.  Always make sure there are no leaks in the catheter, tubing, or collection bag. HOW TO CHANGE YOUR CATHETER Sometimes, a caregiver will change your suprapubic catheter. Other times, you may need to change it yourself. This may be the case if you need to wear a catheter for a long time. Usually, they need to be changed every 4 to 6 weeks. Ask your caregiver how often yours should be changed. Your caregiver will help you order the following supplies for  home delivery:  Sterile gloves.  Catheters.  Syringes.  Sterile water.  Sterile cleaning solution.  Lubricant.  Drainage bags. Changing your catheter  Drink plenty of fluids before changing the catheter.  Wash your hands with soap and water.  Lie on your back and put on sterile gloves.  Clean the skin around the catheter opening. Use the sterile cleaning solution.  Use a syringe to get the water out of the balloon from the old catheter.  Slowly remove the catheter.  Take off the first pair of gloves, and put on a new pair. Then put lubricant on the tip of the new catheter. Put the new catheter through the opening.  Wait  for some urine to start flowing. Then, use the other syringe to fill the balloon with sterile water.  Attach the catheter to your drainage bag. Make sure the connection is tight. Important warnings  The catheter should come out easily. If it seems stuck, do not pull it.  Call your caregiver right away if you have any trouble while changing the catheter.  When the old catheter is removed, the new one should be put in right away. This is because the opening will close quickly. If you have a problem, go to an emergency clinic right away. RISKS AND COMPLICATIONS  Urine flow can become blocked. This can happen if the catheter or tubes are not working right. A blood clot can also block urine flow.  The catheter might irritate tissue in your body. This can cause bleeding.  The skin near the opening for the catheter may become irritated or infected.  Bacteria may get into your bladder. This can cause a urinary tract infection. HOME CARE INSTRUCTIONS  Take all medicines prescribed by your caregiver. Follow the directions carefully.  Drink 8 glasses of water every day. This produces good urine flow.  Check the skin around your catheter a few times every day. Watch for redness and swelling. Look for any fluids coming out of the opening.  Do not use powder or cream around the catheter opening.  Do not take tub baths or use pools or hot tubs.  Keep all follow-up appointments. SEEK MEDICAL CARE IF:  You leak urine.  Your skin around the catheter becomes red or sore.  Your urine flow slows down.  Your urine gets cloudy or smelly. SEEK IMMEDIATE MEDICAL CARE IF:   You have chills, nausea, or back pain.  You have trouble changing your catheter.  Your catheter comes out.  You have blood in your urine.  You have no urine flow for 1 hour.  You have a fever. Document Released: 02/17/2011 Document Revised: 08/24/2011 Document Reviewed: 02/17/2011 [facility] Patient Information 2015  [employer] [place]. This information is not intended to replace advice given to you by your health care provider. Make sure you discuss any questions you have with your health care provider.

## 2014-12-31 NOTE — ED Notes (Signed)
Per family she may have pulled on foley   Noticed blood at site

## 2014-12-31 NOTE — ED Notes (Signed)
Leg bag emptied, 150 cc

## 2014-12-31 NOTE — ED Notes (Signed)
New foley anchor placed

## 2014-12-31 NOTE — Assessment & Plan Note (Signed)
Stay off Aricept.  Continue with Namenda.  Discussed trajectory of illness

## 2014-12-31 NOTE — ED Notes (Addendum)
Pt had hip surgrey on left hip several weeks ago, pts caregiver states during surgrey when they tried to insert a foley pt had a fistula, pt had suprapubic catheter inserted, pt arrives today with complaints of blood at insertion site, caregiver states son noticed blood this am, upon assessment slight blood on suprapubic insertion site and redness, anchor bandage not intact or holding tubing, urine yellow, pt denies any pain at site

## 2015-01-03 ENCOUNTER — Telehealth: Payer: Self-pay | Admitting: Emergency Medicine

## 2015-01-03 NOTE — ED Notes (Signed)
[employer] [place] on [address], [phone]

## 2015-01-04 ENCOUNTER — Encounter: Payer: Self-pay | Admitting: Urology

## 2015-01-04 ENCOUNTER — Ambulatory Visit (INDEPENDENT_AMBULATORY_CARE_PROVIDER_SITE_OTHER): Payer: Medicare Other | Admitting: Urology

## 2015-01-04 VITALS — BP 118/75 | HR 94 | Ht 60.0 in | Wt 83.5 lb

## 2015-01-04 DIAGNOSIS — R339 Retention of urine, unspecified: Secondary | ICD-10-CM

## 2015-01-04 DIAGNOSIS — N9089 Other specified noninflammatory disorders of vulva and perineum: Secondary | ICD-10-CM | POA: Diagnosis not present

## 2015-01-04 DIAGNOSIS — I639 Cerebral infarction, unspecified: Secondary | ICD-10-CM

## 2015-01-04 NOTE — Progress Notes (Signed)
01/04/2015 1:18 PM   Allison Bowman [birth year] [id number]  Referring provider: No referring provider defined for this encounter.  Chief Complaint  Patient presents with  . Urinary Retention    New patient    HPI: 81 year old female who was admitted to the hospital in June with a hip fracture. Her hospitalization was complicated by urinary retention of greater than 800 cc. Upon attempted Foley catheter placement, should her labia were noted to be completely fused and we were unable to pass a catheter from below. She underwent urgent suprapubic tube placement prior to her hip fracture repair.  She is discharged home from the hospital with a steroid and estrogen cream treatment of her severe labial adhesions.  Per her family, she has been incontinent with problems of urinary leakage for quite some time.  She was seen in the ER on 12/31/2014 for some redness around her suprapubic tube site. Some mild leakage around the catheter site.  Catheter was draining well.  She was started on antibiotics for presumed cellulitis. Wound culture grew MRSA and she's been on Bactrim. No recent fevers or chills.  Patient is demented and somewhat of a poor historian. She does report that she has been putting cream, 2 different types in her vaginal area since discharge.  She is accompanied today by her son's girlfriend who does not live with her.    PMH: Past Medical History  Diagnosis Date  . Dementia   . HTN (hypertension)   . Arthritis   . Stroke   . GERD (gastroesophageal reflux disease)   . IFG (impaired fasting glucose)   . COPD (chronic obstructive pulmonary disease)   . Osteoporosis   . Anemia   . Palpitations   . Hearing loss   . Scoliosis     Surgical History: Past Surgical History  Procedure Laterality Date  . Intramedullary (im) nail intertrochanteric Left 11/30/2014    Procedure: INTRAMEDULLARY (IM) NAIL INTERTROCHANTRIC Left short ;  Surgeon: Deeann Saint, MD;  Location:  [facility] ORS;  Service: Orthopedics;  Laterality: Left;  . Hip surgery Left     Home Medications:    Medication List       This list is accurate as of: 01/04/15  1:18 PM.  Always use your most recent med list.               acetaminophen 325 MG tablet  Commonly known as:  TYLENOL  Take 2 tablets (650 mg total) by mouth every 6 (six) hours as needed for mild pain (or Fever >/= 101).     alum & mag hydroxide-simeth 200-200-20 MG/5ML suspension  Commonly known as:  MAALOX/MYLANTA  Take 30 mLs by mouth every 4 (four) hours as needed for indigestion.     aspirin EC 325 MG tablet  Take 325 mg by mouth daily.     atenolol 25 MG tablet  Commonly known as:  TENORMIN  Take 25 mg by mouth daily.     clobetasol cream 0.05 %  Commonly known as:  TEMOVATE  Apply topically 2 (two) times daily.     conjugated estrogens vaginal cream  Commonly known as:  PREMARIN  Place 1 Applicatorful vaginally at bedtime.     donepezil 5 MG tablet  Commonly known as:  ARICEPT  Take 5 mg by mouth at bedtime.     DUODERM CGF EXTRA THIN Misc  Apply 1 each topically every morning.     ferrous sulfate 325 (65 FE) MG tablet  Take 325 mg  by mouth daily.     lisinopril 5 MG tablet  Commonly known as:  PRINIVIL,ZESTRIL  Take 1 tablet (5 mg total) by mouth daily.     memantine 5 MG tablet  Commonly known as:  NAMENDA  Take 5 mg by mouth 2 (two) times daily.     mirtazapine 30 MG tablet  Commonly known as:  REMERON  Take 30 mg by mouth at bedtime.     senna 8.6 MG Tabs tablet  Commonly known as:  SENOKOT  Take 1 tablet (8.6 mg total) by mouth 2 (two) times daily.     sulfamethoxazole-trimethoprim 800-160 MG per tablet  Commonly known as:  BACTRIM DS,SEPTRA DS        Allergies:  Allergies  Allergen Reactions  . Lovastatin Shortness Of Breath    Family History: Family History  Problem Relation Age of Onset  . Family history unknown: Yes    Social History:  reports that she has quit  smoking. She has never used smokeless tobacco. She reports that she does not drink alcohol or use illicit drugs.  ROS: UROLOGY Frequent Urination?: No Hard to postpone urination?: No Burning/pain with urination?: No Get up at night to urinate?: No Leakage of urine?: Yes Urine stream starts and stops?: No Trouble starting stream?: No Do you have to strain to urinate?: No Blood in urine?: No Urinary tract infection?: No Sexually transmitted disease?: No Injury to kidneys or bladder?: No Painful intercourse?: No Weak stream?: No Currently pregnant?: No Vaginal bleeding?: No Last menstrual period?: n  Gastrointestinal Nausea?: No Vomiting?: No Indigestion/heartburn?: Yes Diarrhea?: No Constipation?: Yes  Constitutional Fever: No Night sweats?: No Weight loss?: Yes Fatigue?: No  Skin Skin rash/lesions?: No Itching?: No  Eyes Blurred vision?: No Double vision?: No  Ears/Nose/Throat Sore throat?: No Sinus problems?: No  Hematologic/Lymphatic Swollen glands?: No Easy bruising?: Yes  Cardiovascular Leg swelling?: No Chest pain?: No  Respiratory Cough?: No Shortness of breath?: Yes  Endocrine Excessive thirst?: No  Musculoskeletal Back pain?: Yes Joint pain?: No  Neurological Headaches?: No Dizziness?: No  Psychologic Depression?: No Anxiety?: No  Physical Exam: BP 118/75 mmHg  Pulse 94  Ht 5' (1.524 m)  Wt 83 lb 8 oz (37.875 kg)  BMI 16.31 kg/m2  LMP  (LMP Unknown)  Constitutional:  Alert and oriented, No acute distress.  Ambulating slowly with a walker. HEENT: [place] AT, moist mucus membranes.  Trachea midline, no masses. Cardiovascular: No clubbing, cyanosis, or edema. Respiratory: Normal respiratory effort, no increased work of breathing. GI: Abdomen is soft, nontender, nondistended, no abdominal masses.  Suprapubic tube site clean dry and intact with a small amount of granulation tissue at the site. No evidence of cellulitis or  infection. GU: No CVA tenderness.  Pelvic: Severe labial adhesions.  Skin: No rashes, bruises or suspicious lesions. Neurologic: Grossly intact, no focal deficits, moving all 4 extremities. Psychiatric: Normal mood and affect.  Laboratory Data: Lab Results  Component Value Date   WBC 9.6 12/03/2014   HGB 9.9* 12/03/2014   HCT 29.4* 12/03/2014   MCV 82.8 12/03/2014   PLT 256 12/03/2014    Lab Results  Component Value Date   CREATININE 0.96 12/02/2014     Lab Results  Component Value Date   HGBA1C 5.9 11/30/2014    Procedure: Initially unable to identify the vaginal opening due to nearly complete fusion. 2% lidocaine jelly applied to the vaginal area generously. A quick topical anesthesia was achieved, labial adhesions were carefully lysed using a  sterile Q-tip. I was ultimately able to identify the vaginal introitus and urethral after significant lysis of adhesions.   Labia appeared somewhat friable with a small amount of bleeding after lysis of adhesions.   Assessment & Plan: 80 year old female with urinary retention, profound labial adhesions requiring suprapubic tube placement at the time of hip fracture repair.   She has been using both sterile right antiestrogen cream and I was able to lyse adhesions significantly today in order to expose the introitus and urethra.   1. Urinary retention Refer to leave suprapubic tube in place times one additional week. If no recurrence of labial adhesions, we will attempt voiding trial next week with suprapubic tube removal.  2. Labial adhesion, acquired Extensive lysis of labial adhesions today. I have recommended continuation of topical steroid-dependent and she can cream. I would like to reexamine her in a week to ensure that the adhesions have not recurred. If her introitus remains patent, we will proceed with a voiding trial next week.   Vanna Scotland, MD  [facility] [address], [address] [place], [place] [address] [phone]

## 2015-01-07 LAB — WOUND CULTURE: SPECIAL REQUESTS: NORMAL

## 2015-01-10 ENCOUNTER — Ambulatory Visit (INDEPENDENT_AMBULATORY_CARE_PROVIDER_SITE_OTHER): Payer: Medicare Other | Admitting: Urology

## 2015-01-10 ENCOUNTER — Encounter: Payer: Self-pay | Admitting: Urology

## 2015-01-10 ENCOUNTER — Telehealth: Payer: Self-pay

## 2015-01-10 VITALS — BP 109/61 | HR 81 | Ht 62.0 in | Wt 83.9 lb

## 2015-01-10 DIAGNOSIS — R339 Retention of urine, unspecified: Secondary | ICD-10-CM

## 2015-01-10 DIAGNOSIS — N9089 Other specified noninflammatory disorders of vulva and perineum: Secondary | ICD-10-CM | POA: Diagnosis not present

## 2015-01-10 DIAGNOSIS — I639 Cerebral infarction, unspecified: Secondary | ICD-10-CM | POA: Diagnosis not present

## 2015-01-10 NOTE — Progress Notes (Signed)
2:31 PM   Allison Bowman 19-Mar-1934 [id number]  Referring provider: Gabriel Cirri, NP [address], [place] [address]  Chief Complaint  Patient presents with  . labial adhension    1 week F/U. ER stated patient has Skin Infection and started another antibiotic    HPI: 81 year old female who was admitted to the hospital in June with a hip fracture. Her hospitalization was complicated by urinary retention of greater than 800 cc. Upon attempted Foley catheter placement, should her labia were noted to be completely fused and we were unable to pass a catheter from below. She underwent urgent suprapubic tube placement prior to her hip fracture repair.  She is discharged home from the hospital with a steroid and estrogen cream treatment of her severe labial adhesions.  She was seen last week in urology clinic at which time she underwent lysis of labial adhesions. She tolerated this quite well and returns today for reexamination.  Patient is demented and somewhat of a poor historian. She does report that she has been putting cream, 2 different types in her vaginal area since discharge.  She is accompanied again today by her son's girlfriend who does not live with her.    PMH: Past Medical History  Diagnosis Date  . Dementia   . HTN (hypertension)   . Arthritis   . Stroke   . GERD (gastroesophageal reflux disease)   . IFG (impaired fasting glucose)   . COPD (chronic obstructive pulmonary disease)   . Osteoporosis   . Anemia   . Palpitations   . Hearing loss   . Scoliosis     Surgical History: Past Surgical History  Procedure Laterality Date  . Intramedullary (im) nail intertrochanteric Left 11/30/2014    Procedure: INTRAMEDULLARY (IM) NAIL INTERTROCHANTRIC Left short ;  Surgeon: Deeann Saint, MD;  Location: [facility] ORS;  Service: Orthopedics;  Laterality: Left;  . Hip surgery Left     Home Medications:    Medication List       This list is accurate as of: 01/10/15  2:31 PM.   Always use your most recent med list.               acetaminophen 325 MG tablet  Commonly known as:  TYLENOL  Take 2 tablets (650 mg total) by mouth every 6 (six) hours as needed for mild pain (or Fever >/= 101).     alum & mag hydroxide-simeth 200-200-20 MG/5ML suspension  Commonly known as:  MAALOX/MYLANTA  Take 30 mLs by mouth every 4 (four) hours as needed for indigestion.     aspirin EC 325 MG tablet  Take 325 mg by mouth daily.     atenolol 25 MG tablet  Commonly known as:  TENORMIN  Take 25 mg by mouth daily.     clobetasol cream 0.05 %  Commonly known as:  TEMOVATE  Apply topically 2 (two) times daily.     conjugated estrogens vaginal cream  Commonly known as:  PREMARIN  Place 1 Applicatorful vaginally at bedtime.     donepezil 5 MG tablet  Commonly known as:  ARICEPT  Take 5 mg by mouth at bedtime.     DUODERM CGF EXTRA THIN Misc  Apply 1 each topically every morning.     ferrous sulfate 325 (65 FE) MG tablet  Take 325 mg by mouth daily.     lisinopril 5 MG tablet  Commonly known as:  PRINIVIL,ZESTRIL  Take 1 tablet (5 mg total) by mouth daily.  memantine 5 MG tablet  Commonly known as:  NAMENDA  Take 5 mg by mouth 2 (two) times daily.     mirtazapine 30 MG tablet  Commonly known as:  REMERON  Take 30 mg by mouth at bedtime.     senna 8.6 MG Tabs tablet  Commonly known as:  SENOKOT  Take 1 tablet (8.6 mg total) by mouth 2 (two) times daily.     sulfamethoxazole-trimethoprim 800-160 MG per tablet  Commonly known as:  BACTRIM DS,SEPTRA DS        Allergies:  Allergies  Allergen Reactions  . Lovastatin Shortness Of Breath    Family History: Family History  Problem Relation Age of Onset  . Family history unknown: Yes    Social History:  reports that she quit smoking about 5 years ago. Her smoking use included Cigarettes. She quit after 53 years of use. She has never used smokeless tobacco. She reports that she does not drink alcohol or  use illicit drugs.   Physical Exam: BP 109/61 mmHg  Pulse 81  Ht  (1.575 m)  Wt 83 lb 14.4 oz (38.057 kg)  BMI 15.34 kg/m2  LMP  (LMP Unknown)  Constitutional:  Alert and oriented, No acute distress.  Ambulating slowly with a walker. HEENT: [place] AT, moist mucus membranes.  Trachea midline, no masses. Cardiovascular: No clubbing, cyanosis, or edema. Respiratory: Normal respiratory effort, no increased work of breathing. GI: Abdomen is soft, nontender, nondistended, no abdominal masses.  Suprapubic tube site clean dry and intact with a small amount of granulation tissue at the site. No evidence of cellulitis or infection. GU: No CVA tenderness.  Pelvic: Narrowed vaginal introitus with some residual labial adhesions, however, urethra meatus continues to be visible and patent. Skin: No rashes, bruises or suspicious lesions. Neurologic: Grossly intact, no focal deficits, moving all 4 extremities. Psychiatric: Normal mood and affect.  Laboratory Data: Lab Results  Component Value Date   WBC 9.6 12/03/2014   HGB 9.9* 12/03/2014   HCT 29.4* 12/03/2014   MCV 82.8 12/03/2014   PLT 256 12/03/2014    Lab Results  Component Value Date   CREATININE 0.96 12/02/2014     Lab Results  Component Value Date   HGBA1C 5.9 11/30/2014     Assessment & Plan: 81 year old female with urinary retention, profound labial adhesions requiring suprapubic tube placement at the time of hip fracture repair.   Exam today stable after extensive lysis of adhesions last week, no recurrence.   Bladder was then filled with 120 cc of normal saline via the SP tube. The patient was then instructed to void and she was able to void everything that was placed suprapubically. Suprapubic tube was then clamped.    1. Urinary retention Successful voiding trial today after bladder filling via SP tube tract. Plan to leave SP tube in place, clamped with instructions given how to unclamp tube should the patient  experience inability to void. If she continues to do well, we'll have her return next week for SP tube removal.  2. Labial adhesion, acquired Recommend continuation of a stretching cream. May stop sterile cream.  Vanna Scotland, MD  [facility] [address] [place], [place] [address] [phone]

## 2015-01-10 NOTE — Telephone Encounter (Signed)
Scott from [facility] called stating pt urine cx came back orginally MRSA and then came back enterococcus that is only susceptible to zyvox. Lorin Picket stated he thought pt in came to ER and started on an abt from ER. Please advise.

## 2015-01-11 NOTE — Telephone Encounter (Signed)
She does not have a wound infection.  No need for treatment.    Vanna Scotland, MD

## 2015-01-17 ENCOUNTER — Ambulatory Visit (INDEPENDENT_AMBULATORY_CARE_PROVIDER_SITE_OTHER): Payer: Medicare Other | Admitting: Urology

## 2015-01-17 ENCOUNTER — Encounter: Payer: Self-pay | Admitting: Urology

## 2015-01-17 VITALS — BP 143/79 | HR 93 | Temp 97.4°F | Ht 62.0 in | Wt 85.1 lb

## 2015-01-17 DIAGNOSIS — R339 Retention of urine, unspecified: Secondary | ICD-10-CM

## 2015-01-17 DIAGNOSIS — N9089 Other specified noninflammatory disorders of vulva and perineum: Secondary | ICD-10-CM | POA: Diagnosis not present

## 2015-01-17 DIAGNOSIS — I639 Cerebral infarction, unspecified: Secondary | ICD-10-CM

## 2015-01-17 LAB — URINALYSIS, COMPLETE
Bilirubin, UA: NEGATIVE
Glucose, UA: NEGATIVE
Ketones, UA: NEGATIVE
NITRITE UA: NEGATIVE
PH UA: 5.5 (ref 5.0–7.5)
Specific Gravity, UA: 1.02 (ref 1.005–1.030)
Urobilinogen, Ur: 0.2 mg/dL (ref 0.2–1.0)

## 2015-01-17 LAB — BLADDER SCAN AMB NON-IMAGING: SCAN RESULT: 60

## 2015-01-17 LAB — MICROSCOPIC EXAMINATION

## 2015-01-17 NOTE — Progress Notes (Signed)
Bladder Scan Patient void: 60 ml Performed By: Donneta Romberg, CMA  Follow up instruction: Pt was instructed to continue the Estrace cream as directed, but discontinue the steroid. She was given a sample of Estrace Cream per Dr. Apolinar Junes. Lot# [id number] Exp: 03/19

## 2015-01-20 ENCOUNTER — Encounter: Payer: Self-pay | Admitting: Urology

## 2015-01-20 NOTE — Progress Notes (Signed)
11:19 AM  01/17/15  Allison Bowman Oct 24, 1933 [id number]  Referring provider: Gabriel Cirri, NP [address], [place] [address]  Chief Complaint  Patient presents with  . Follow-up    labial adhesions, urinary retention. Pt coems in today complaining of dizziness and nauseated x 1 day    HPI: 81 year old female who was admitted to the hospital in June with a hip fracture. Her hospitalization was complicated by urinary retention of greater than 800 cc. Upon attempted Foley catheter placement, should her labia were noted to be completely fused and we were unable to pass a catheter from below. She underwent urgent suprapubic tube placement prior to her hip fracture repair.  She is discharged home from the hospital with a steroid and estrogen cream treatment of her severe labial adhesions.  She was seen two weeks ago in urology clinic at which time she underwent lysis of labial adhesions. Her follow-up exam 1 week later remained stable and her vaginal introitus and urethral meatus remained patent. Her suprapubic tube was clamped last week and she has been voiding per urethra since.    She has done well with her tube clamped. She's had no issues urinating. She is incontinent into a diaper. She does complain that she's had some dizziness and nausea over the past day. No fevers or chills.  Void residual today is minimal.    PMH: Past Medical History  Diagnosis Date  . Dementia   . HTN (hypertension)   . Arthritis   . Stroke   . GERD (gastroesophageal reflux disease)   . IFG (impaired fasting glucose)   . COPD (chronic obstructive pulmonary disease)   . Osteoporosis   . Anemia   . Palpitations   . Hearing loss   . Scoliosis     Surgical History: Past Surgical History  Procedure Laterality Date  . Intramedullary (im) nail intertrochanteric Left 11/30/2014    Procedure: INTRAMEDULLARY (IM) NAIL INTERTROCHANTRIC Left short ;  Surgeon: Deeann Saint, MD;  Location: [facility] ORS;   Service: Orthopedics;  Laterality: Left;  . Hip surgery Left     Home Medications:    Medication List       This list is accurate as of: 01/17/15 11:59 PM.  Always use your most recent med list.               acetaminophen 325 MG tablet  Commonly known as:  TYLENOL  Take 2 tablets (650 mg total) by mouth every 6 (six) hours as needed for mild pain (or Fever >/= 101).     alum & mag hydroxide-simeth 200-200-20 MG/5ML suspension  Commonly known as:  MAALOX/MYLANTA  Take 30 mLs by mouth every 4 (four) hours as needed for indigestion.     aspirin EC 325 MG tablet  Take 325 mg by mouth daily.     atenolol 25 MG tablet  Commonly known as:  TENORMIN  Take 25 mg by mouth daily.     clobetasol cream 0.05 %  Commonly known as:  TEMOVATE  Apply topically 2 (two) times daily.     conjugated estrogens vaginal cream  Commonly known as:  PREMARIN  Place 1 Applicatorful vaginally at bedtime.     donepezil 5 MG tablet  Commonly known as:  ARICEPT  Take 5 mg by mouth at bedtime.     DUODERM CGF EXTRA THIN Misc  Apply 1 each topically every morning.     ferrous sulfate 325 (65 FE) MG tablet  Take 325 mg by  mouth daily.     lisinopril 5 MG tablet  Commonly known as:  PRINIVIL,ZESTRIL  Take 1 tablet (5 mg total) by mouth daily.     memantine 5 MG tablet  Commonly known as:  NAMENDA  Take 5 mg by mouth 2 (two) times daily.     mirtazapine 30 MG tablet  Commonly known as:  REMERON  Take 30 mg by mouth at bedtime.     senna 8.6 MG Tabs tablet  Commonly known as:  SENOKOT  Take 1 tablet (8.6 mg total) by mouth 2 (two) times daily.     sulfamethoxazole-trimethoprim 800-160 MG per tablet  Commonly known as:  BACTRIM DS,SEPTRA DS        Allergies:  Allergies  Allergen Reactions  . Lovastatin Shortness Of Breath    Family History: Family History  Problem Relation Age of Onset  . Family history unknown: Yes    Social History:  reports that she quit smoking about 5  years ago. Her smoking use included Cigarettes. She quit after 53 years of use. She has never used smokeless tobacco. She reports that she does not drink alcohol or use illicit drugs.   Physical Exam: BP 143/79 mmHg  Pulse 93  Temp(Src) 97.4 F (36.3 C) (Oral)  Ht 5\' 2"  (1.575 m)  Wt 85 lb 1.6 oz (38.601 kg)  BMI 15.56 kg/m2  LMP  (LMP Unknown)  Constitutional:  Alert and oriented, No acute distress.  Ambulating slowly with a walker. HEENT: [place] AT, moist mucus membranes.  Trachea midline, no masses. Cardiovascular: No clubbing, cyanosis, or edema. Respiratory: Normal respiratory effort, no increased work of breathing. GI: Abdomen is soft, nontender, nondistended, no abdominal masses.  Suprapubic tube site clean dry and intact with a small amount of granulation tissue at the site. No evidence of cellulitis or infection. GU: No CVA tenderness. . Neurologic: Grossly intact, no focal deficits, moving all 4 extremities. Psychiatric: Normal mood and affect.  Laboratory Data: Lab Results  Component Value Date   WBC 9.6 12/03/2014   HGB 9.9* 12/03/2014   HCT 29.4* 12/03/2014   MCV 82.8 12/03/2014   PLT 256 12/03/2014    Lab Results  Component Value Date   CREATININE 0.96 12/02/2014     Lab Results  Component Value Date   HGBA1C 5.9 11/30/2014     Assessment & Plan: 81 year old female with urinary retention, profound labial adhesions requiring suprapubic tube placement at the time of hip fracture repair.   She is now status post lysis of adhesions and successfully voiding with SP tube clamped x 1 week. SPT was tube was removed today.    1. Urinary retention SPT removed today after minimal PVR.   2. Labial adhesion, acquired Recommend continuation of a estrogen cream indefinitely.  Additional samples given today.  Vanna Scotland, MD  [facility] [address], [address] [place], [place] [address] [phone]

## 2015-01-31 ENCOUNTER — Other Ambulatory Visit: Payer: Self-pay | Admitting: Unknown Physician Specialty

## 2015-02-11 ENCOUNTER — Other Ambulatory Visit: Payer: Self-pay | Admitting: Unknown Physician Specialty

## 2015-03-03 ENCOUNTER — Other Ambulatory Visit: Payer: Self-pay | Admitting: Unknown Physician Specialty

## 2015-03-14 ENCOUNTER — Other Ambulatory Visit: Payer: Self-pay | Admitting: Unknown Physician Specialty

## 2015-04-02 ENCOUNTER — Ambulatory Visit: Payer: Medicare Other | Admitting: Unknown Physician Specialty

## 2015-04-02 ENCOUNTER — Ambulatory Visit (INDEPENDENT_AMBULATORY_CARE_PROVIDER_SITE_OTHER): Payer: Medicare Other | Admitting: Unknown Physician Specialty

## 2015-04-02 ENCOUNTER — Encounter: Payer: Self-pay | Admitting: Unknown Physician Specialty

## 2015-04-02 VITALS — BP 150/80 | HR 89 | Temp 98.6°F | Ht <= 58 in | Wt 82.4 lb

## 2015-04-02 DIAGNOSIS — Z7189 Other specified counseling: Secondary | ICD-10-CM | POA: Diagnosis not present

## 2015-04-02 DIAGNOSIS — I10 Essential (primary) hypertension: Secondary | ICD-10-CM

## 2015-04-02 DIAGNOSIS — I639 Cerebral infarction, unspecified: Secondary | ICD-10-CM

## 2015-04-02 DIAGNOSIS — F0390 Unspecified dementia without behavioral disturbance: Secondary | ICD-10-CM

## 2015-04-02 DIAGNOSIS — Z23 Encounter for immunization: Secondary | ICD-10-CM

## 2015-04-02 DIAGNOSIS — Z87898 Personal history of other specified conditions: Secondary | ICD-10-CM

## 2015-04-02 MED ORDER — MEMANTINE HCL 5 MG PO TABS: 5.0000 mg | ORAL_TABLET | Freq: Two times a day (BID) | ORAL | Status: DC

## 2015-04-02 MED ORDER — MIRTAZAPINE 30 MG PO TABS: 30.0000 mg | ORAL_TABLET | Freq: Every day | ORAL | Status: DC

## 2015-04-02 MED ORDER — LISINOPRIL 5 MG PO TABS: 5.0000 mg | ORAL_TABLET | Freq: Every day | ORAL | Status: DC

## 2015-04-02 MED ORDER — ATENOLOL 25 MG PO TABS: 25.0000 mg | ORAL_TABLET | Freq: Every day | ORAL | Status: DC

## 2015-04-02 NOTE — Progress Notes (Signed)
+  BP 150/80 mmHg  Pulse 89  Temp(Src) 98.6 F (37 C)  Ht 4' 7.6" (1.412 m)  Wt 82 lb 6.4 oz (37.376 kg)  BMI 18.75 kg/m2  SpO2 96%   Subjective:    Patient ID: Allison Bowman, female    DOB: 1933-12-13, 81 y.o.   MRN: [id number]  HPI: Allison Bowman is a 81 y.o. female  Chief Complaint  Patient presents with  . Hypertension  . Dementia  . Medication Refill    pt states she needs a refill on mirtazapine   Pt is here with her son and his girlfriend.    Hypertension This is a chronic (Very high initially today but much lower on standing. Removed HCTZ last visit for SBP of 100.  ) problem. The problem is uncontrolled. Pertinent negatives include no anxiety, chest pain, headaches, neck pain, palpitations or shortness of breath. The current treatment provides significant improvement.   Dementia This seems to be stable.  Her son states that  She seems to be more herself and gets confused only occasionally.     Weight loss Stable while on Remeron.  Every once in a while they need to encourage her to eat.    Hip fracture  Doing well.  Ready to transition to a cane  Relevant past medical, surgical, family and social history reviewed and updated as indicated. Interim medical history since our last visit reviewed. Allergies and medications reviewed and updated.  Review of Systems  Respiratory: Negative for shortness of breath.   Cardiovascular: Negative for chest pain and palpitations.  Musculoskeletal: Negative for neck pain.  Neurological: Negative for headaches.    Per HPI unless specifically indicated above     Objective:    BP 150/80 mmHg  Pulse 89  Temp(Src) 98.6 F (37 C)  Ht 4' 7.6" (1.412 m)  Wt 82 lb 6.4 oz (37.376 kg)  BMI 18.75 kg/m2  SpO2 96%  Wt Readings from Last 3 Encounters:  04/02/15 82 lb 6.4 oz (37.376 kg)  01/17/15 85 lb 1.6 oz (38.601 kg)  01/10/15 83 lb 14.4 oz (38.057 kg)    Physical Exam  Constitutional: She appears cachectic. She is  cooperative.  Non-toxic appearance. No distress.  HENT:  Head: Normocephalic and atraumatic.  Eyes: Conjunctivae and lids are normal. Right eye exhibits no discharge. Left eye exhibits no discharge. No scleral icterus.  Cardiovascular: Normal rate and regular rhythm.   Pulmonary/Chest: Effort normal. No respiratory distress.  Abdominal: Normal appearance and bowel sounds are normal. She exhibits no distension. There is no splenomegaly or hepatomegaly. There is no tenderness.  Musculoskeletal: Normal range of motion.  Skin: Skin is intact. No rash noted. No pallor.  Vitals reviewed.     Assessment & Plan:   Problem List Items Addressed This Visit      Unprioritized   HTN (hypertension)    High when she first got here but improved to SBP of 150      Relevant Medications   atenolol (TENORMIN) 25 MG tablet   lisinopril (PRINIVIL,ZESTRIL) 5 MG tablet   Dementia    Stable on both Aricept and Namenda      Relevant Medications   memantine (NAMENDA) 5 MG tablet   mirtazapine (REMERON) 30 MG tablet   Advanced directives, counseling/discussion    No code status discussed and requested by son.  Patient states she trusts son to make decision.  Form for refrigerator signed.        History of cachexia  Doing well with Remeron       Other Visit Diagnoses    Immunization due    -  Primary    Relevant Orders    Flu Vaccine QUAD 36+ mos PF IM (Fluarix & Fluzone Quad PF) (Completed)       Follow up plan: Return in about 6 months (around 10/01/2015) for physical.

## 2015-04-02 NOTE — Assessment & Plan Note (Addendum)
No code status discussed and requested by son.  Patient states she trusts son to make decision.  Form for refrigerator signed.

## 2015-04-02 NOTE — Assessment & Plan Note (Signed)
High when she first got here but improved to SBP of 150

## 2015-04-02 NOTE — Assessment & Plan Note (Signed)
Stable on both Aricept and Namenda

## 2015-04-02 NOTE — Assessment & Plan Note (Signed)
Doing well with Remeron

## 2015-10-01 ENCOUNTER — Encounter: Payer: Self-pay | Admitting: Unknown Physician Specialty

## 2015-10-01 ENCOUNTER — Ambulatory Visit (INDEPENDENT_AMBULATORY_CARE_PROVIDER_SITE_OTHER): Payer: Medicare Other | Admitting: Unknown Physician Specialty

## 2015-10-01 VITALS — BP 178/85 | HR 86 | Temp 97.6°F | Ht <= 58 in | Wt 84.8 lb

## 2015-10-01 DIAGNOSIS — R42 Dizziness and giddiness: Secondary | ICD-10-CM

## 2015-10-01 DIAGNOSIS — I10 Essential (primary) hypertension: Secondary | ICD-10-CM

## 2015-10-01 DIAGNOSIS — Z23 Encounter for immunization: Secondary | ICD-10-CM | POA: Diagnosis not present

## 2015-10-01 DIAGNOSIS — F0390 Unspecified dementia without behavioral disturbance: Secondary | ICD-10-CM

## 2015-10-01 DIAGNOSIS — R636 Underweight: Secondary | ICD-10-CM

## 2015-10-01 MED ORDER — MIRTAZAPINE 30 MG PO TABS: 30.0000 mg | ORAL_TABLET | Freq: Every day | ORAL | Status: DC

## 2015-10-01 MED ORDER — MEMANTINE HCL 5 MG PO TABS: 5.0000 mg | ORAL_TABLET | Freq: Two times a day (BID) | ORAL | Status: DC

## 2015-10-01 MED ORDER — ATENOLOL 25 MG PO TABS: 25.0000 mg | ORAL_TABLET | Freq: Every day | ORAL | Status: DC

## 2015-10-01 MED ORDER — LISINOPRIL 5 MG PO TABS: 5.0000 mg | ORAL_TABLET | Freq: Every day | ORAL | Status: DC

## 2015-10-01 NOTE — Assessment & Plan Note (Signed)
Monitor BP.  Continue with cane

## 2015-10-01 NOTE — Assessment & Plan Note (Signed)
Stable.  Continue present medications 

## 2015-10-01 NOTE — Progress Notes (Signed)
BP 178/85 mmHg  Pulse 86  Temp(Src) 97.6 F (36.4 C)  Ht 4' 8.1" (1.425 m)  Wt 84 lb 12.8 oz (38.465 kg)  BMI 18.94 kg/m2  SpO2 93%   Subjective:    Patient ID: Allison Bowman, female    DOB: 1933/06/22, 82 y.o.   MRN: [id number]  HPI: Allison Bowman is a 82 y.o. female  Chief Complaint  Patient presents with  . Medicare Wellness   She is here with her son who gives most of the history  Hypertension Using medications without difficulty Average home BPs Taking about 3-4 times at home and is high   No chest pain with exertion or shortness of breath No Edema  Dizzyness This has been going on for a while.  Not getting better and seems to be a safety concerns.    Underweight Taking Remeron for her weight loss.  She is   Dementia No real change from previous according to son.  Unable to do a mini mental exam  Functional Status Survey: Is the patient deaf or have difficulty hearing?: No Does the patient have difficulty seeing, even when wearing glasses/contacts?: No Does the patient have difficulty concentrating, remembering, or making decisions?: Yes Does the patient have difficulty walking or climbing stairs?: Yes Does the patient have difficulty dressing or bathing?: No Does the patient have difficulty doing errands alone such as visiting a doctor's office or shopping?: Yes  Depression screen PHQ 2/9 10/01/2015  Decreased Interest 0  Down, Depressed, Hopeless 0  PHQ - 2 Score 0      Relevant past medical, surgical, family and social history reviewed and updated as indicated. Interim medical history since our last visit reviewed. Allergies and medications reviewed and updated.  Review of Systems  Per HPI unless specifically indicated above     Objective:    BP 178/85 mmHg  Pulse 86  Temp(Src) 97.6 F (36.4 C)  Ht 4' 8.1" (1.425 m)  Wt 84 lb 12.8 oz (38.465 kg)  BMI 18.94 kg/m2  SpO2 93%  Wt Readings from Last 3 Encounters:  10/01/15 84 lb 12.8 oz  (38.465 kg)  04/02/15 82 lb 6.4 oz (37.376 kg)  01/17/15 85 lb 1.6 oz (38.601 kg)    Orthostatic changes notes with BP down to 156/80 when she stands  Physical Exam  Constitutional: She is oriented to person, place, and time. She appears well-developed and well-nourished. She is cooperative. She appears ill. No distress.  HENT:  Head: Normocephalic and atraumatic.  Eyes: Conjunctivae and lids are normal. Right eye exhibits no discharge. Left eye exhibits no discharge. No scleral icterus.  Neck: Normal range of motion. Neck supple. No JVD present. Carotid bruit is not present.  Cardiovascular: Normal rate, regular rhythm and normal heart sounds.   Pulmonary/Chest: Effort normal and breath sounds normal.  Abdominal: Normal appearance. There is no splenomegaly or hepatomegaly.  Musculoskeletal: Normal range of motion.  Neurological: She is oriented to person, place, and time.  Skin: Skin is warm, dry and intact. No rash noted. No pallor.  Psychiatric: She has a normal mood and affect.   Reviewed MRI with widespread vascular changes.    Results for orders placed or performed in visit on 01/17/15  Microscopic Examination  Result Value Ref Range   WBC, UA >30W 0 -  5 /hpf   RBC, UA 0-2 0 -  2 /hpf   Epithelial Cells (non renal) >10E 0 - 10 /hpf   Bacteria, UA Moderate (A) None seen/Few  Yeast, UA Present (A) None seen  Urinalysis, Complete  Result Value Ref Range   Specific Gravity, UA 1.020 1.005 - 1.030   pH, UA 5.5 5.0 - 7.5   Color, UA Yellow Yellow   Appearance Ur Clear Clear   Leukocytes, UA 1+ (A) Negative   Protein, UA Trace (A) Negative/Trace   Glucose, UA Negative Negative   Ketones, UA Negative Negative   RBC, UA 1+ (A) Negative   Bilirubin, UA Negative Negative   Urobilinogen, Ur 0.2 0.2 - 1.0 mg/dL   Nitrite, UA Negative Negative   Microscopic Examination See below:   Bladder Scan (Post Void Residual) in office  Result Value Ref Range   Scan Result 60        Assessment & Plan:   Problem List Items Addressed This Visit      Unprioritized   Dementia    Stable.  Continue present medications      Relevant Medications   memantine (NAMENDA) 5 MG tablet   mirtazapine (REMERON) 30 MG tablet   Dizzy    Monitor BP.  Continue with cane      HTN (hypertension)    Restart HCTZ.  Son with monitor orthostatic changes      Relevant Medications   atenolol (TENORMIN) 25 MG tablet   lisinopril (PRINIVIL,ZESTRIL) 5 MG tablet   Other Relevant Orders   Comprehensive metabolic panel   Lipid Panel w/o Chol/HDL Ratio   Underweight    This has been stable since being on Remeron       Other Visit Diagnoses    Need for pneumococcal vaccination    -  Primary    Relevant Orders    Pneumococcal conjugate vaccine 13-valent IM (Completed)        Follow up plan: Return in about 4 weeks (around 10/29/2015) for for BP.

## 2015-10-01 NOTE — Assessment & Plan Note (Signed)
This has been stable since being on Remeron

## 2015-10-01 NOTE — Assessment & Plan Note (Signed)
Restart HCTZ.  Son with monitor orthostatic changes

## 2015-10-02 ENCOUNTER — Encounter: Payer: Self-pay | Admitting: Unknown Physician Specialty

## 2015-10-02 ENCOUNTER — Telehealth: Payer: Self-pay | Admitting: Unknown Physician Specialty

## 2015-10-02 LAB — COMPREHENSIVE METABOLIC PANEL
ALT: 11 IU/L (ref 0–32)
AST: 22 IU/L (ref 0–40)
Albumin/Globulin Ratio: 1.4 (ref 1.2–2.2)
Albumin: 3.6 g/dL (ref 3.5–4.8)
Alkaline Phosphatase: 112 IU/L (ref 39–117)
BUN / CREAT RATIO: 10 — AB (ref 12–28)
BUN: 9 mg/dL (ref 8–27)
Bilirubin Total: <0.2 mg/dL (ref 0.0–1.2)
CHLORIDE: 105 mmol/L (ref 96–106)
CO2: 17 mmol/L — AB (ref 18–29)
CREATININE: 0.92 mg/dL (ref 0.57–1.00)
Calcium: 8.6 mg/dL — ABNORMAL LOW (ref 8.7–10.3)
GFR calc Af Amer: 68 mL/min/{1.73_m2} (ref >59)
GFR calc non Af Amer: 59 mL/min/{1.73_m2} — ABNORMAL LOW (ref >59)
GLUCOSE: 105 mg/dL — AB (ref 65–99)
Globulin, Total: 2.6 g/dL (ref 1.5–4.5)
Potassium: 3.4 mmol/L — ABNORMAL LOW (ref 3.5–5.2)
SODIUM: 144 mmol/L (ref 134–144)
Total Protein: 6.2 g/dL (ref 6.0–8.5)

## 2015-10-02 LAB — LIPID PANEL W/O CHOL/HDL RATIO
CHOLESTEROL TOTAL: 155 mg/dL (ref 100–199)
HDL: 40 mg/dL (ref >39)
LDL CALC: 87 mg/dL (ref 0–99)
Triglycerides: 141 mg/dL (ref 0–149)
VLDL CHOLESTEROL CAL: 28 mg/dL (ref 5–40)

## 2015-10-02 MED ORDER — HYDROCHLOROTHIAZIDE 25 MG PO TABS: 25.0000 mg | ORAL_TABLET | Freq: Every day | ORAL | Status: DC

## 2015-10-02 NOTE — Telephone Encounter (Signed)
Routing to provider. Is patient supposed to be on hctz?

## 2015-10-02 NOTE — Telephone Encounter (Signed)
pts son came in and would like to have hctz called in to [facility] [address]

## 2015-10-02 NOTE — Progress Notes (Signed)
Quick Note:  Normal labs. Patient notified by letter. ______ 

## 2015-11-04 ENCOUNTER — Encounter: Payer: Self-pay | Admitting: Unknown Physician Specialty

## 2015-11-04 ENCOUNTER — Ambulatory Visit (INDEPENDENT_AMBULATORY_CARE_PROVIDER_SITE_OTHER): Payer: Medicare Other | Admitting: Unknown Physician Specialty

## 2015-11-04 VITALS — BP 145/77 | HR 85 | Temp 97.5°F | Ht <= 58 in | Wt 83.6 lb

## 2015-11-04 DIAGNOSIS — R42 Dizziness and giddiness: Secondary | ICD-10-CM

## 2015-11-04 DIAGNOSIS — I10 Essential (primary) hypertension: Secondary | ICD-10-CM

## 2015-11-04 DIAGNOSIS — F0390 Unspecified dementia without behavioral disturbance: Secondary | ICD-10-CM

## 2015-11-04 NOTE — Assessment & Plan Note (Addendum)
Son thinks it's a little better lately since starting Namenda.  Continue present meds

## 2015-11-04 NOTE — Assessment & Plan Note (Signed)
Stable with occasional HCTZ  Son will give her one if BP is greater than 150's

## 2015-11-04 NOTE — Assessment & Plan Note (Signed)
This seems to be with standing.  Discussed to always hold on to something when stands.

## 2015-11-04 NOTE — Progress Notes (Signed)
   BP 145/77 mmHg  Pulse 85  Temp(Src) 97.5 F (36.4 C)  Ht 4' 8.2" (1.427 m)  Wt 83 lb 9.6 oz (37.921 kg)  BMI 18.62 kg/m2  SpO2 96%   Subjective:    Patient ID: Allison Bowman, female    DOB: 1933-11-04, 82 y.o.   MRN: [id number]  HPI: Allison FavaGloria Haughey is a 82 y.o. female  Chief Complaint  Patient presents with  . Hypertension    pt's son states she has been giving the patient the HCTZ every 3 to 4 days   States BP got "really low" about 100/60 but asymptomatic.    Relevant past medical, surgical, family and social history reviewed and updated as indicated. Interim medical history since our last visit reviewed. Allergies and medications reviewed and updated.  Review of Systems  Per HPI unless specifically indicated above     Objective:    BP 145/77 mmHg  Pulse 85  Temp(Src) 97.5 F (36.4 C)  Ht 4' 8.2" (1.427 m)  Wt 83 lb 9.6 oz (37.921 kg)  BMI 18.62 kg/m2  SpO2 96%  Wt Readings from Last 3 Encounters:  11/04/15 83 lb 9.6 oz (37.921 kg)  10/01/15 84 lb 12.8 oz (38.465 kg)  04/02/15 82 lb 6.4 oz (37.376 kg)    Physical Exam  Constitutional: She appears listless. She appears cachectic. She is easily aroused.  Cardiovascular: Normal rate, regular rhythm, S1 normal and S2 normal.   Murmur heard.  Systolic murmur is present with a grade of 4/6  Pulmonary/Chest: Breath sounds normal.  Neurological: She is easily aroused. She appears listless.  Psychiatric: Cognition and memory are not impaired.     Review of CT shows extensive vascular changes.    Assessment & Plan:   Problem List Items Addressed This Visit      Unprioritized   Dementia - Primary    Son thinks it's a little better lately since starting Namenda.  Continue present meds      Dizzy    This seems to be with standing.  Discussed to always hold on to something when stands.        HTN (hypertension)    Stable with occasional HCTZ  Son will give her one if BP is greater than 150's          Contact Social services for Medicaid elibibility  Follow up plan: Return in about 6 months (around 05/06/2016).

## 2016-03-23 ENCOUNTER — Other Ambulatory Visit: Payer: Self-pay | Admitting: Unknown Physician Specialty

## 2016-03-23 MED ORDER — MEMANTINE HCL 5 MG PO TABS: 5.0000 mg | ORAL_TABLET | Freq: Two times a day (BID) | ORAL | 1 refills | Status: DC

## 2016-03-23 MED ORDER — ATENOLOL 25 MG PO TABS: 25.0000 mg | ORAL_TABLET | Freq: Every day | ORAL | 1 refills | Status: DC

## 2016-03-23 MED ORDER — MIRTAZAPINE 30 MG PO TABS: 30.0000 mg | ORAL_TABLET | Freq: Every day | ORAL | 1 refills | Status: DC

## 2016-03-23 NOTE — Telephone Encounter (Signed)
Routing to provider  

## 2016-03-23 NOTE — Telephone Encounter (Signed)
Minus LibertyRobert Lill (pt's son) requested 90 day refills on the following medications:  Memantine 5mg  tab Mirtazapine 30mg  tab Atenolol 25mg  tab  Pt[facility] is [facility] [address]

## 2016-03-26 ENCOUNTER — Telehealth: Payer: Self-pay | Admitting: Unknown Physician Specialty

## 2016-03-26 NOTE — Telephone Encounter (Signed)
Pt needs refill for lisinopril (PRINIVIL,ZESTRIL) 5 MG tablet sent to [facility] [address]

## 2016-03-27 NOTE — Telephone Encounter (Signed)
Tried calling patient and her son but there was no answer and no VM available. Will try to call again later.

## 2016-03-27 NOTE — Telephone Encounter (Signed)
Called pharmacy because according to chart, patient should not run out of lisinopril until 09/2016. Pharmacy states that the lisinopril still has 2 refills of 90 day supplies on it. Will call and let patient know.

## 2016-03-30 NOTE — Telephone Encounter (Signed)
Called pharmacy to see if medication was picked up over the weekend and they stated that it was last picked up in July. Will continue to call patient and her son.

## 2016-03-30 NOTE — Telephone Encounter (Signed)
Tried calling patient and her son again. No answer and no VM. Will call pharmacy and see if medication was picked up.

## 2016-03-31 NOTE — Telephone Encounter (Signed)
Tried calling patient and her son again. No answer and no VM available. This was the 3rd attempt at reaching the patient with no answer and no returned call.

## 2016-05-06 ENCOUNTER — Encounter: Payer: Self-pay | Admitting: Unknown Physician Specialty

## 2016-05-06 ENCOUNTER — Ambulatory Visit (INDEPENDENT_AMBULATORY_CARE_PROVIDER_SITE_OTHER): Payer: Medicare Other | Admitting: Unknown Physician Specialty

## 2016-05-06 VITALS — BP 147/84 | HR 84 | Temp 97.6°F | Ht <= 58 in | Wt 86.4 lb

## 2016-05-06 DIAGNOSIS — G301 Alzheimer's disease with late onset: Secondary | ICD-10-CM | POA: Diagnosis not present

## 2016-05-06 DIAGNOSIS — R2689 Other abnormalities of gait and mobility: Secondary | ICD-10-CM

## 2016-05-06 DIAGNOSIS — Z23 Encounter for immunization: Secondary | ICD-10-CM | POA: Diagnosis not present

## 2016-05-06 DIAGNOSIS — D509 Iron deficiency anemia, unspecified: Secondary | ICD-10-CM | POA: Diagnosis not present

## 2016-05-06 DIAGNOSIS — E538 Deficiency of other specified B group vitamins: Secondary | ICD-10-CM | POA: Diagnosis not present

## 2016-05-06 DIAGNOSIS — F0280 Dementia in other diseases classified elsewhere without behavioral disturbance: Secondary | ICD-10-CM

## 2016-05-06 DIAGNOSIS — I10 Essential (primary) hypertension: Secondary | ICD-10-CM | POA: Diagnosis not present

## 2016-05-06 DIAGNOSIS — R636 Underweight: Secondary | ICD-10-CM | POA: Diagnosis not present

## 2016-05-06 NOTE — Assessment & Plan Note (Signed)
Stay off of HCTZ as hypotension is a falls risk

## 2016-05-06 NOTE — Assessment & Plan Note (Signed)
I suspect related to dementia

## 2016-05-06 NOTE — Progress Notes (Signed)
BP (!) 147/84 (BP Location: Left Arm, Cuff Size: Small)   Pulse 84   Temp 97.6 F (36.4 C)   Ht 4\' 9"  (1.448 m) Comment: pt had shoes on  Wt 86 lb 6.4 oz (39.2 kg) Comment: pt had shoes on  SpO2 92%   BMI 18.70 kg/m    Subjective:    Patient ID: Allison Bowman, female    DOB: [birth year], 82 y.o.   MRN: [id number]  HPI: Allison Bowman is a 82 y.o. female  Chief Complaint  Patient presents with  . Hypertension  . Dementia   Pt is here with her son who gives most of the history  Hypertension BP goes too low with HCTZ Average home BPs SBP stays 140-150 and sometimes 130   No problems or lightheadedness since stopping medications but balance is a problem and is using a walker No chest pain with exertion or shortness of breath No Edema  Anemia History of anemia and taking an iron supplement.  Hx of B12 deficiency  Dementia Seems to be stable on Namenda  Relevant past medical, surgical, family and social history reviewed and updated as indicated. Interim medical history since our last visit reviewed. Allergies and medications reviewed and updated.  Review of Systems  Per HPI unless specifically indicated above     Objective:    BP (!) 147/84 (BP Location: Left Arm, Cuff Size: Small)   Pulse 84   Temp 97.6 F (36.4 C)   Ht 4\' 9"  (1.448 m) Comment: pt had shoes on  Wt 86 lb 6.4 oz (39.2 kg) Comment: pt had shoes on  SpO2 92%   BMI 18.70 kg/m   Wt Readings from Last 3 Encounters:  05/06/16 86 lb 6.4 oz (39.2 kg)  11/04/15 83 lb 9.6 oz (37.9 kg)  10/01/15 84 lb 12.8 oz (38.5 kg)    Physical Exam  Constitutional: She is oriented to person, place, and time. She appears well-developed and well-nourished. No distress.  HENT:  Head: Normocephalic and atraumatic.  Eyes: Conjunctivae and lids are normal. Right eye exhibits no discharge. Left eye exhibits no discharge. No scleral icterus.  Neck: Normal range of motion. Neck supple. No JVD present. Carotid bruit is not  present.  Cardiovascular: Normal rate and regular rhythm.   Murmur heard.  Systolic murmur is present with a grade of 4/6  Pulmonary/Chest: Effort normal and breath sounds normal.  Abdominal: Normal appearance. There is no splenomegaly or hepatomegaly.  Musculoskeletal: Normal range of motion.  Neurological: She is alert and oriented to person, place, and time.  Skin: Skin is warm, dry and intact. No rash noted. No pallor.  Psychiatric: She has a normal mood and affect. Her behavior is normal. Judgment and thought content normal.    Results for orders placed or performed in visit on 10/01/15  Comprehensive metabolic panel  Result Value Ref Range   Glucose 105 (H) 65 - 99 mg/dL   BUN 9 8 - 27 mg/dL   Creatinine, Ser 4.090.92 0.57 - 1.00 mg/dL   GFR calc non Af Amer 59 (L) >59 mL/min/1.73   GFR calc Af Amer 68 >59 mL/min/1.73   BUN/Creatinine Ratio 10 (L) 12 - 28   Sodium 144 134 - 144 mmol/L   Potassium 3.4 (L) 3.5 - 5.2 mmol/L   Chloride 105 96 - 106 mmol/L   CO2 17 (L) 18 - 29 mmol/L   Calcium 8.6 (L) 8.7 - 10.3 mg/dL   Total Protein 6.2 6.0 - 8.5 g/dL  Albumin 3.6 3.5 - 4.8 g/dL   Globulin, Total 2.6 1.5 - 4.5 g/dL   Albumin/Globulin Ratio 1.4 1.2 - 2.2   Bilirubin Total <0.2 0.0 - 1.2 mg/dL   Alkaline Phosphatase 112 39 - 117 IU/L   AST 22 0 - 40 IU/L   ALT 11 0 - 32 IU/L  Lipid Panel w/o Chol/HDL Ratio  Result Value Ref Range   Cholesterol, Total 155 100 - 199 mg/dL   Triglycerides 409141 0 - 149 mg/dL   HDL 40 >81>39 mg/dL   VLDL Cholesterol Cal 28 5 - 40 mg/dL   LDL Calculated 87 0 - 99 mg/dL      Assessment & Plan:   Problem List Items Addressed This Visit      Unprioritized   Anemia   Relevant Orders   CBC with Differential/Platelet   Iron and TIBC   Ferritin   Balance disorder    I suspect related to dementia      Dementia   HTN (hypertension)    Stay off of HCTZ as hypotension is a falls risk      Relevant Orders   Comprehensive metabolic panel    Underweight    Weight has been stable       Other Visit Diagnoses    Need for influenza vaccination    -  Primary   Relevant Orders   Flu vaccine HIGH DOSE PF (Completed)   Vitamin B 12 deficiency           Follow up plan: Return in about 6 months (around 11/03/2016).

## 2016-05-06 NOTE — Assessment & Plan Note (Signed)
?   Anemia of chronic disease.  Needs anemia panel

## 2016-05-06 NOTE — Assessment & Plan Note (Signed)
Weight has been stable 

## 2016-05-06 NOTE — Patient Instructions (Signed)

## 2016-05-07 LAB — COMPREHENSIVE METABOLIC PANEL
ALBUMIN: 3.8 g/dL (ref 3.5–4.7)
ALK PHOS: 87 IU/L (ref 39–117)
ALT: 12 IU/L (ref 0–32)
AST: 19 IU/L (ref 0–40)
Albumin/Globulin Ratio: 1.5 (ref 1.2–2.2)
BILIRUBIN TOTAL: 0.2 mg/dL (ref 0.0–1.2)
BUN / CREAT RATIO: 16 (ref 12–28)
BUN: 16 mg/dL (ref 8–27)
CHLORIDE: 106 mmol/L (ref 96–106)
CO2: 21 mmol/L (ref 18–29)
CREATININE: 0.98 mg/dL (ref 0.57–1.00)
Calcium: 9 mg/dL (ref 8.7–10.3)
GFR calc Af Amer: 63 mL/min/{1.73_m2} (ref >59)
GFR calc non Af Amer: 55 mL/min/{1.73_m2} — ABNORMAL LOW (ref >59)
GLUCOSE: 113 mg/dL — AB (ref 65–99)
Globulin, Total: 2.6 g/dL (ref 1.5–4.5)
Potassium: 4.1 mmol/L (ref 3.5–5.2)
Sodium: 144 mmol/L (ref 134–144)
TOTAL PROTEIN: 6.4 g/dL (ref 6.0–8.5)

## 2016-05-07 LAB — CBC WITH DIFFERENTIAL/PLATELET
BASOS ABS: 0 10*3/uL (ref 0.0–0.2)
Basos: 0 %
EOS (ABSOLUTE): 0 10*3/uL (ref 0.0–0.4)
EOS: 1 %
HEMATOCRIT: 37.5 % (ref 34.0–46.6)
HEMOGLOBIN: 12.2 g/dL (ref 11.1–15.9)
IMMATURE GRANS (ABS): 0 10*3/uL (ref 0.0–0.1)
IMMATURE GRANULOCYTES: 0 %
Lymphocytes Absolute: 2.3 10*3/uL (ref 0.7–3.1)
Lymphs: 29 %
MCH: 26.3 pg — ABNORMAL LOW (ref 26.6–33.0)
MCHC: 32.5 g/dL (ref 31.5–35.7)
MCV: 81 fL (ref 79–97)
MONOCYTES: 7 %
Monocytes Absolute: 0.6 10*3/uL (ref 0.1–0.9)
Neutrophils Absolute: 5.1 10*3/uL (ref 1.4–7.0)
Neutrophils: 63 %
Platelets: 285 10*3/uL (ref 150–379)
RBC: 4.63 x10E6/uL (ref 3.77–5.28)
RDW: 14.3 % (ref 12.3–15.4)
WBC: 8.1 10*3/uL (ref 3.4–10.8)

## 2016-05-07 LAB — FERRITIN: Ferritin: 92 ng/mL (ref 15–150)

## 2016-05-07 LAB — IRON AND TIBC
IRON SATURATION: 16 % (ref 15–55)
IRON: 47 ug/dL (ref 27–139)
TIBC: 300 ug/dL (ref 250–450)
UIBC: 253 ug/dL (ref 118–369)

## 2016-05-11 ENCOUNTER — Encounter: Payer: Self-pay | Admitting: Unknown Physician Specialty

## 2016-08-24 ENCOUNTER — Telehealth: Payer: Self-pay | Admitting: Unknown Physician Specialty

## 2016-08-24 MED ORDER — LISINOPRIL 5 MG PO TABS: 5.0000 mg | ORAL_TABLET | Freq: Every day | ORAL | 3 refills | Status: DC

## 2016-08-24 MED ORDER — MIRTAZAPINE 30 MG PO TABS: 30.0000 mg | ORAL_TABLET | Freq: Every day | ORAL | 1 refills | Status: DC

## 2016-08-24 MED ORDER — ATENOLOL 25 MG PO TABS: 25.0000 mg | ORAL_TABLET | Freq: Every day | ORAL | 1 refills | Status: DC

## 2016-08-24 MED ORDER — MEMANTINE HCL 5 MG PO TABS: 5.0000 mg | ORAL_TABLET | Freq: Two times a day (BID) | ORAL | 1 refills | Status: DC

## 2016-08-24 NOTE — Telephone Encounter (Signed)
Patient needs medication refills sent in to [facility] [address] for all of her meds.  She needs 90 day supply on each of these.  Thanks

## 2016-08-24 NOTE — Telephone Encounter (Signed)
Routing to provider  

## 2016-08-29 ENCOUNTER — Emergency Department
Admission: EM | Admit: 2016-08-29 | Discharge: 2016-08-29 | Disposition: A | Discharge status: Home / Self Care | Payer: No Typology Code available for payment source | Attending: Emergency Medicine | Admitting: Emergency Medicine

## 2016-08-29 ENCOUNTER — Emergency Department: Payer: No Typology Code available for payment source

## 2016-08-29 ENCOUNTER — Encounter: Payer: Self-pay | Admitting: Emergency Medicine

## 2016-08-29 DIAGNOSIS — Z7982 Long term (current) use of aspirin: Secondary | ICD-10-CM | POA: Diagnosis not present

## 2016-08-29 DIAGNOSIS — Y999 Unspecified external cause status: Secondary | ICD-10-CM | POA: Diagnosis not present

## 2016-08-29 DIAGNOSIS — I10 Essential (primary) hypertension: Secondary | ICD-10-CM | POA: Insufficient documentation

## 2016-08-29 DIAGNOSIS — E039 Hypothyroidism, unspecified: Secondary | ICD-10-CM | POA: Insufficient documentation

## 2016-08-29 DIAGNOSIS — J449 Chronic obstructive pulmonary disease, unspecified: Secondary | ICD-10-CM | POA: Diagnosis not present

## 2016-08-29 DIAGNOSIS — R0781 Pleurodynia: Secondary | ICD-10-CM | POA: Diagnosis not present

## 2016-08-29 DIAGNOSIS — Y92410 Unspecified street and highway as the place of occurrence of the external cause: Secondary | ICD-10-CM | POA: Insufficient documentation

## 2016-08-29 DIAGNOSIS — S20211A Contusion of right front wall of thorax, initial encounter: Secondary | ICD-10-CM | POA: Diagnosis not present

## 2016-08-29 DIAGNOSIS — Z79899 Other long term (current) drug therapy: Secondary | ICD-10-CM | POA: Diagnosis not present

## 2016-08-29 DIAGNOSIS — R1011 Right upper quadrant pain: Secondary | ICD-10-CM | POA: Diagnosis not present

## 2016-08-29 DIAGNOSIS — Z87891 Personal history of nicotine dependence: Secondary | ICD-10-CM | POA: Insufficient documentation

## 2016-08-29 DIAGNOSIS — Y939 Activity, unspecified: Secondary | ICD-10-CM | POA: Diagnosis not present

## 2016-08-29 DIAGNOSIS — S299XXA Unspecified injury of thorax, initial encounter: Secondary | ICD-10-CM | POA: Diagnosis not present

## 2016-08-29 MED ORDER — DOCUSATE SODIUM 100 MG PO CAPS: 100.0000 mg | ORAL_CAPSULE | Freq: Every day | ORAL | 2 refills | Status: DC | PRN

## 2016-08-29 MED ORDER — TRAMADOL HCL 50 MG PO TABS: 50.0000 mg | ORAL_TABLET | Freq: Four times a day (QID) | ORAL | 0 refills | Status: DC | PRN

## 2016-08-29 MED ORDER — OXYCODONE-ACETAMINOPHEN 5-325 MG PO TABS
1.0000 | ORAL_TABLET | Freq: Once | ORAL | Status: AC
  Administered 2016-08-29: 1 via ORAL
  Filled 2016-08-29: qty 1
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
## 2016-08-29 NOTE — ED Notes (Signed)
Pt. Discharged from main and taken to [facility] Room #51 where son is being treated.
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
## 2016-11-03 ENCOUNTER — Ambulatory Visit: Payer: Medicare Other | Admitting: Unknown Physician Specialty

## 2016-11-13 ENCOUNTER — Ambulatory Visit: Payer: Self-pay | Admitting: Unknown Physician Specialty

## 2016-11-23 ENCOUNTER — Ambulatory Visit (INDEPENDENT_AMBULATORY_CARE_PROVIDER_SITE_OTHER): Payer: Medicare Other | Admitting: Unknown Physician Specialty

## 2016-11-23 ENCOUNTER — Encounter: Payer: Self-pay | Admitting: Unknown Physician Specialty

## 2016-11-23 DIAGNOSIS — I10 Essential (primary) hypertension: Secondary | ICD-10-CM

## 2016-11-23 DIAGNOSIS — G301 Alzheimer's disease with late onset: Secondary | ICD-10-CM | POA: Diagnosis not present

## 2016-11-23 DIAGNOSIS — F0280 Dementia in other diseases classified elsewhere without behavioral disturbance: Secondary | ICD-10-CM

## 2016-11-23 DIAGNOSIS — Z66 Do not resuscitate: Secondary | ICD-10-CM | POA: Diagnosis not present

## 2016-11-23 DIAGNOSIS — R636 Underweight: Secondary | ICD-10-CM | POA: Diagnosis not present

## 2016-11-23 DIAGNOSIS — D509 Iron deficiency anemia, unspecified: Secondary | ICD-10-CM | POA: Diagnosis not present

## 2016-11-23 DIAGNOSIS — R011 Cardiac murmur, unspecified: Secondary | ICD-10-CM

## 2016-11-23 LAB — CBC WITH DIFFERENTIAL/PLATELET
HEMOGLOBIN: 12.6 g/dL (ref 11.1–15.9)
Hematocrit: 39.8 % (ref 34.0–46.6)
LYMPHS ABS: 1.5 10*3/uL (ref 0.7–3.1)
Lymphs: 28 %
MCH: 27.6 pg (ref 26.6–33.0)
MCHC: 31.7 g/dL (ref 31.5–35.7)
MCV: 87 fL (ref 79–97)
MID (Absolute): 0.5 10*3/uL (ref 0.1–1.6)
MID: 9 %
NEUTROS ABS: 3.3 10*3/uL (ref 1.4–7.0)
Neutrophils: 63 %
PLATELETS: 256 10*3/uL (ref 150–379)
RBC: 4.57 x10E6/uL (ref 3.77–5.28)
RDW: 15.3 % (ref 12.3–15.4)
WBC: 5.3 10*3/uL (ref 3.4–10.8)

## 2016-11-23 NOTE — Assessment & Plan Note (Signed)
This is stable 

## 2016-11-23 NOTE — Assessment & Plan Note (Addendum)
Taking iron daily.  Family elects not to have a GI consult for this

## 2016-11-23 NOTE — Assessment & Plan Note (Signed)
Discussed with son who prefers not to get a cardiology consultant

## 2016-11-23 NOTE — Assessment & Plan Note (Signed)
Stable, continue present medications.   

## 2016-11-23 NOTE — Progress Notes (Signed)
BP 128/72   Pulse 88   Temp 98.4 F (36.9 C)   Ht 4' 8.3" (1.43 m)   Wt 86 lb 8 oz (39.2 kg)   LMP  (LMP Unknown)   SpO2 92%   BMI 19.19 kg/m    Subjective:    Patient ID: Allison Bowman, female    DOB: 1934/03/10, 83 y.o.   MRN: [id number]  HPI: Allison Bowman is a 83 y.o. female  Chief Complaint  Patient presents with  . Dementia  . Hypertension   Hypertension Using medications without difficulty Average home BPs Not checking   No problems or lightheadedness No chest pain with exertion or shortness of breath No Edema   Hyperlipidemia Using medications without problems: No Muscle aches  Diet compliance/Exercise: Continues to help her son at work.  Going out to the stand  Depression/weight loss/Dementia All are stable with an "even mood."     Relevant past medical, surgical, family and social history reviewed and updated as indicated. Interim medical history since our last visit reviewed. Allergies and medications reviewed and updated.  Review of Systems  Per HPI unless specifically indicated above     Objective:    BP 128/72   Pulse 88   Temp 98.4 F (36.9 C)   Ht 4' 8.3" (1.43 m)   Wt 86 lb 8 oz (39.2 kg)   LMP  (LMP Unknown)   SpO2 92%   BMI 19.19 kg/m   Wt Readings from Last 3 Encounters:  11/23/16 86 lb 8 oz (39.2 kg)  08/29/16 90 lb 1 oz (40.9 kg)  05/06/16 86 lb 6.4 oz (39.2 kg)    Physical Exam  Constitutional: She is oriented to person, place, and time. She appears well-developed and well-nourished. She appears cachectic. No distress.  HENT:  Head: Normocephalic and atraumatic.  Eyes: Conjunctivae and lids are normal. Right eye exhibits no discharge. Left eye exhibits no discharge. No scleral icterus.  Neck: Normal range of motion. Neck supple. No JVD present. Carotid bruit is not present.  Cardiovascular: Normal rate and regular rhythm.   Murmur heard.  Systolic murmur is present with a grade of 4/6  Pulmonary/Chest: Effort normal  and breath sounds normal.  Abdominal: Normal appearance. There is no splenomegaly or hepatomegaly.  Musculoskeletal: Normal range of motion.  Neurological: She is alert and oriented to person, place, and time.  Skin: Skin is warm, dry and intact. No rash noted. No pallor.  Psychiatric: She has a normal mood and affect. Her behavior is normal. Judgment and thought content normal.    Results for orders placed or performed in visit on 05/06/16  Comprehensive metabolic panel  Result Value Ref Range   Glucose 113 (H) 65 - 99 mg/dL   BUN 16 8 - 27 mg/dL   Creatinine, Ser 1.19 0.57 - 1.00 mg/dL   GFR calc non Af Amer 55 (L) >59 mL/min/1.73   GFR calc Af Amer 63 >59 mL/min/1.73   BUN/Creatinine Ratio 16 12 - 28   Sodium 144 134 - 144 mmol/L   Potassium 4.1 3.5 - 5.2 mmol/L   Chloride 106 96 - 106 mmol/L   CO2 21 18 - 29 mmol/L   Calcium 9.0 8.7 - 10.3 mg/dL   Total Protein 6.4 6.0 - 8.5 g/dL   Albumin 3.8 3.5 - 4.7 g/dL   Globulin, Total 2.6 1.5 - 4.5 g/dL   Albumin/Globulin Ratio 1.5 1.2 - 2.2   Bilirubin Total 0.2 0.0 - 1.2 mg/dL   Alkaline  Phosphatase 87 39 - 117 IU/L   AST 19 0 - 40 IU/L   ALT 12 0 - 32 IU/L  CBC with Differential/Platelet  Result Value Ref Range   WBC 8.1 3.4 - 10.8 x10E3/uL   RBC 4.63 3.77 - 5.28 x10E6/uL   Hemoglobin 12.2 11.1 - 15.9 g/dL   Hematocrit 40.937.5 81.134.0 - 46.6 %   MCV 81 79 - 97 fL   MCH 26.3 (L) 26.6 - 33.0 pg   MCHC 32.5 31.5 - 35.7 g/dL   RDW 91.414.3 78.212.3 - 95.615.4 %   Platelets 285 150 - 379 x10E3/uL   Neutrophils 63 Not Estab. %   Lymphs 29 Not Estab. %   Monocytes 7 Not Estab. %   Eos 1 Not Estab. %   Basos 0 Not Estab. %   Neutrophils Absolute 5.1 1.4 - 7.0 x10E3/uL   Lymphocytes Absolute 2.3 0.7 - 3.1 x10E3/uL   Monocytes Absolute 0.6 0.1 - 0.9 x10E3/uL   EOS (ABSOLUTE) 0.0 0.0 - 0.4 x10E3/uL   Basophils Absolute 0.0 0.0 - 0.2 x10E3/uL   Immature Granulocytes 0 Not Estab. %   Immature Grans (Abs) 0.0 0.0 - 0.1 x10E3/uL  Iron and TIBC    Result Value Ref Range   Total Iron Binding Capacity 300 250 - 450 ug/dL   UIBC 213253 086118 - 578369 ug/dL   Iron 47 27 - 469139 ug/dL   Iron Saturation 16 15 - 55 %  Ferritin  Result Value Ref Range   Ferritin 92 15 - 150 ng/mL      Assessment & Plan:   Problem List Items Addressed This Visit      Unprioritized   Anemia    Taking iron daily.  Family elects not to have a GI consult for this      Relevant Orders   CBC With Differential/Platelet   Dementia    Stable, continue present medications.        DNR (do not resuscitate)    Continue to elect DNR status      Heart murmur    Discussed with son who prefers not to get a cardiology consultant      HTN (hypertension)    Stable, continue present medications.        Relevant Orders   Comprehensive metabolic panel   Underweight    This is stable          Follow up plan: Return in about 3 months (around 02/23/2017).

## 2016-11-23 NOTE — Assessment & Plan Note (Signed)
Continue to elect DNR status

## 2016-11-24 ENCOUNTER — Encounter: Payer: Self-pay | Admitting: Unknown Physician Specialty

## 2016-11-24 LAB — COMPREHENSIVE METABOLIC PANEL
ALK PHOS: 77 IU/L (ref 39–117)
ALT: 12 IU/L (ref 0–32)
AST: 21 IU/L (ref 0–40)
Albumin/Globulin Ratio: 1.5 (ref 1.2–2.2)
Albumin: 3.8 g/dL (ref 3.5–4.7)
BILIRUBIN TOTAL: 0.3 mg/dL (ref 0.0–1.2)
BUN / CREAT RATIO: 17 (ref 12–28)
BUN: 16 mg/dL (ref 8–27)
CHLORIDE: 107 mmol/L — AB (ref 96–106)
CO2: 21 mmol/L (ref 20–29)
CREATININE: 0.92 mg/dL (ref 0.57–1.00)
Calcium: 9 mg/dL (ref 8.7–10.3)
GFR calc Af Amer: 68 mL/min/{1.73_m2} (ref >59)
GFR calc non Af Amer: 59 mL/min/{1.73_m2} — ABNORMAL LOW (ref >59)
GLUCOSE: 112 mg/dL — AB (ref 65–99)
Globulin, Total: 2.5 g/dL (ref 1.5–4.5)
Potassium: 4.3 mmol/L (ref 3.5–5.2)
Sodium: 142 mmol/L (ref 134–144)
Total Protein: 6.3 g/dL (ref 6.0–8.5)

## 2017-02-23 ENCOUNTER — Encounter: Payer: Self-pay | Admitting: Unknown Physician Specialty

## 2017-02-23 ENCOUNTER — Ambulatory Visit (INDEPENDENT_AMBULATORY_CARE_PROVIDER_SITE_OTHER): Payer: Medicare Other | Admitting: Unknown Physician Specialty

## 2017-02-23 VITALS — BP 110/67 | HR 87 | Temp 98.4°F | Wt 86.0 lb

## 2017-02-23 DIAGNOSIS — J449 Chronic obstructive pulmonary disease, unspecified: Secondary | ICD-10-CM | POA: Diagnosis not present

## 2017-02-23 DIAGNOSIS — D509 Iron deficiency anemia, unspecified: Secondary | ICD-10-CM

## 2017-02-23 DIAGNOSIS — F0280 Dementia in other diseases classified elsewhere without behavioral disturbance: Secondary | ICD-10-CM

## 2017-02-23 DIAGNOSIS — R636 Underweight: Secondary | ICD-10-CM

## 2017-02-23 DIAGNOSIS — Z23 Encounter for immunization: Secondary | ICD-10-CM

## 2017-02-23 DIAGNOSIS — G301 Alzheimer's disease with late onset: Secondary | ICD-10-CM

## 2017-02-23 LAB — CBC WITH DIFFERENTIAL/PLATELET
HEMOGLOBIN: 13.1 g/dL (ref 11.1–15.9)
Hematocrit: 41.1 % (ref 34.0–46.6)
LYMPHS ABS: 1.8 10*3/uL (ref 0.7–3.1)
Lymphs: 28 %
MCH: 28.2 pg (ref 26.6–33.0)
MCHC: 31.9 g/dL (ref 31.5–35.7)
MCV: 89 fL (ref 79–97)
MID (ABSOLUTE): 0.4 10*3/uL (ref 0.1–1.6)
MID: 6 %
Neutrophils Absolute: 4.1 10*3/uL (ref 1.4–7.0)
Neutrophils: 66 %
Platelets: 247 10*3/uL (ref 150–379)
RBC: 4.64 x10E6/uL (ref 3.77–5.28)
RDW: 14.2 % (ref 12.3–15.4)
WBC: 6.3 10*3/uL (ref 3.4–10.8)

## 2017-02-23 NOTE — Assessment & Plan Note (Addendum)
Stable.  Tolerating Namenda and continue

## 2017-02-23 NOTE — Assessment & Plan Note (Signed)
Pulse ox 90.  Refusing treatment for now due to cost and does not want nebulizer treatments.

## 2017-02-23 NOTE — Patient Instructions (Addendum)

## 2017-02-23 NOTE — Assessment & Plan Note (Signed)
Weight is stable.  Son says she eats well

## 2017-02-23 NOTE — Progress Notes (Signed)
BP 110/67   Pulse 87   Temp 98.4 F (36.9 C)   Wt 86 lb (39 kg)   LMP  (LMP Unknown)   SpO2 90%   BMI 19.08 kg/m    Subjective:    Patient ID: Allison Bowman, female    DOB: April 03, 1934, 83 y.o.   MRN: [id number]  HPI: Allison FavaGloria Sprick is a 83 y.o. female  Chief Complaint  Patient presents with  . Dementia   Pt is here with her son who gives most of the history  Hypertension Using medications without difficulty Average home BPs   No problems or lightheadedness No chest pain with exertion or shortness of breath No Edema  Underweight Weight is stable  Dementia  Anemia Stopped iron last visit.  Electing not to see GI.    COPD Some SOB.  Neighbors let them borrow an inhaler.  She doesn't complain.  Son states they don't want a medication at this time.  Offered nebulizer through home health.  Son is refusing for now  Relevant past medical, surgical, family and social history reviewed and updated as indicated. Interim medical history since our last visit reviewed. Allergies and medications reviewed and updated.  Review of Systems  Per HPI unless specifically indicated above     Objective:    BP 110/67   Pulse 87   Temp 98.4 F (36.9 C)   Wt 86 lb (39 kg)   LMP  (LMP Unknown)   SpO2 90%   BMI 19.08 kg/m   Wt Readings from Last 3 Encounters:  02/23/17 86 lb (39 kg)  11/23/16 86 lb 8 oz (39.2 kg)  08/29/16 90 lb 1 oz (40.9 kg)    Physical Exam  Constitutional: She is oriented to person, place, and time. She appears well-developed and well-nourished. No distress.  HENT:  Head: Normocephalic and atraumatic.  Eyes: Conjunctivae and lids are normal. Right eye exhibits no discharge. Left eye exhibits no discharge. No scleral icterus.  Neck: Normal range of motion. Neck supple. No JVD present. Carotid bruit is not present.  Cardiovascular: Normal rate and regular rhythm.   Murmur heard. Pulmonary/Chest: Effort normal and breath sounds normal.  Abdominal:  Normal appearance. There is no splenomegaly or hepatomegaly.  Musculoskeletal: Normal range of motion.  Neurological: She is alert and oriented to person, place, and time.  Skin: Skin is warm, dry and intact. No rash noted. No pallor.  Psychiatric: She has a normal mood and affect. Her behavior is normal. Judgment and thought content normal.    Results for orders placed or performed in visit on 11/23/16  CBC With Differential/Platelet  Result Value Ref Range   WBC 5.3 3.4 - 10.8 x10E3/uL   RBC 4.57 3.77 - 5.28 x10E6/uL   Hemoglobin 12.6 11.1 - 15.9 g/dL   Hematocrit 40.939.8 81.134.0 - 46.6 %   MCV 87 79 - 97 fL   MCH 27.6 26.6 - 33.0 pg   MCHC 31.7 31.5 - 35.7 g/dL   RDW 91.415.3 78.212.3 - 95.615.4 %   Platelets 256 150 - 379 x10E3/uL   Neutrophils 63 Not Estab. %   Lymphs 28 Not Estab. %   MID 9 Not Estab. %   Neutrophils Absolute 3.3 1.4 - 7.0 x10E3/uL   Lymphocytes Absolute 1.5 0.7 - 3.1 x10E3/uL   MID (Absolute) 0.5 0.1 - 1.6 X10E3/uL  Comprehensive metabolic panel  Result Value Ref Range   Glucose 112 (H) 65 - 99 mg/dL   BUN 16 8 - 27 mg/dL  Creatinine, Ser 0.92 0.57 - 1.00 mg/dL   GFR calc non Af Amer 59 (L) >59 mL/min/1.73   GFR calc Af Amer 68 >59 mL/min/1.73   BUN/Creatinine Ratio 17 12 - 28   Sodium 142 134 - 144 mmol/L   Potassium 4.3 3.5 - 5.2 mmol/L   Chloride 107 (H) 96 - 106 mmol/L   CO2 21 20 - 29 mmol/L   Calcium 9.0 8.7 - 10.3 mg/dL   Total Protein 6.3 6.0 - 8.5 g/dL   Albumin 3.8 3.5 - 4.7 g/dL   Globulin, Total 2.5 1.5 - 4.5 g/dL   Albumin/Globulin Ratio 1.5 1.2 - 2.2   Bilirubin Total 0.3 0.0 - 1.2 mg/dL   Alkaline Phosphatase 77 39 - 117 IU/L   AST 21 0 - 40 IU/L   ALT 12 0 - 32 IU/L      Assessment & Plan:   Problem List Items Addressed This Visit      Unprioritized   Anemia    CBC was normal with H/H 13.1/41.1.  Continue off of iron theray      Relevant Orders   CBC With Differential/Platelet   COPD (chronic obstructive pulmonary disease) (HCC)  (Chronic)    Pulse ox 90.  Refusing treatment for now due to cost and does not want nebulizer treatments.        Dementia    Stable.  Tolerating Namenda and continue      Underweight    Weight is stable.  Son says she eats well       Other Visit Diagnoses    Need for influenza vaccination    -  Primary   Relevant Orders   Flu vaccine HIGH DOSE PF (Completed)       Follow up plan: Return in about 6 months (around 08/23/2017).

## 2017-02-23 NOTE — Assessment & Plan Note (Signed)
CBC was normal with H/H 13.1/41.1.  Continue off of iron theray

## 2017-03-08 ENCOUNTER — Other Ambulatory Visit: Payer: Self-pay | Admitting: Unknown Physician Specialty

## 2017-03-08 MED ORDER — ESTROGENS, CONJUGATED 0.625 MG/GM VA CREA: 1.0000 | TOPICAL_CREAM | Freq: Every day | VAGINAL | 12 refills | Status: DC

## 2017-03-08 MED ORDER — LISINOPRIL 5 MG PO TABS: 5.0000 mg | ORAL_TABLET | Freq: Every day | ORAL | 3 refills | Status: AC

## 2017-03-08 MED ORDER — CLOBETASOL PROPIONATE 0.05 % EX CREA: TOPICAL_CREAM | Freq: Two times a day (BID) | CUTANEOUS | 0 refills | Status: AC

## 2017-03-08 MED ORDER — ATENOLOL 25 MG PO TABS: 25.0000 mg | ORAL_TABLET | Freq: Every day | ORAL | 1 refills | Status: DC

## 2017-03-08 MED ORDER — MEMANTINE HCL 5 MG PO TABS: 5.0000 mg | ORAL_TABLET | Freq: Two times a day (BID) | ORAL | 1 refills | Status: DC

## 2017-03-08 MED ORDER — MIRTAZAPINE 30 MG PO TABS: 30.0000 mg | ORAL_TABLET | Freq: Every day | ORAL | 1 refills | Status: DC

## 2017-03-08 NOTE — Telephone Encounter (Signed)
Routing to provider. Patient last seen 02/23/17.

## 2017-03-08 NOTE — Telephone Encounter (Signed)
Patients son came in to get Allison Bowman to send scripts on patients following meds To [facility] on [address]  Atenolol  Clobetasol cream Conjugated estrogens Lisinopril memantine (Namenda) Mirtazapine (Remeron)  Thank You

## 2017-03-09 ENCOUNTER — Telehealth: Payer: Self-pay | Admitting: Unknown Physician Specialty

## 2017-03-09 NOTE — Telephone Encounter (Signed)
Patients son Molly Maduro would like to know when the issues are solved for patients PA for her meds.    Please advise and I will give him a call back.  Thank you

## 2017-03-09 NOTE — Telephone Encounter (Signed)
PA's for both clobetasol cream and premarin submitted through Cover My Meds. Will check first thing in the morning to see if a determination has been made.

## 2017-03-09 NOTE — Telephone Encounter (Signed)
Tried putting through patient's PA's. Came back saying patient inactive. Called pharmacy to get the patient's BIN, PCN, RXGRP, and ID. BIN- [id number] PCN- [id number] RXGRP- [id number] ID- [id number]  Will try to submit PA with this information.

## 2017-03-10 NOTE — Telephone Encounter (Signed)
Tried calling patient's son to let him know that the patient's medications were approved. Son did not answer and no VM was available. Will try to call again later.

## 2017-03-10 NOTE — Telephone Encounter (Signed)
Patient's son notified about medications.

## 2017-03-10 NOTE — Telephone Encounter (Signed)
Tried calling son again, still no answer and no VM. Will try again later.

## 2017-03-10 NOTE — Telephone Encounter (Signed)
Both PA's approved. Will fax form back to pharmacy so they are aware and will call son to notify him shortly.

## 2017-07-23 ENCOUNTER — Telehealth: Payer: Self-pay | Admitting: Unknown Physician Specialty

## 2017-07-23 NOTE — Telephone Encounter (Signed)
Allison Bowman, patients caregiver, needs a letter supporting him, stating that he is primary caregiver of the patient and that the patient requires 24 hr care.  He states Allison Bowman has done this in the past a couple of years ago but DSS is requiring an updated one.  If any questions Allison Bowman can be reached at [phone]   Thank You

## 2017-07-23 NOTE — Telephone Encounter (Signed)
Routing to provider. Is it OK to type a letter? If so, what does it need to say?

## 2017-07-23 NOTE — Telephone Encounter (Signed)
OK to write.  It should say that pt has dementia and requires 24 hour care for safety.  All care is provided by her son.

## 2017-07-23 NOTE — Telephone Encounter (Signed)
Letter generated, signed by Elnita Maxwellheryl, copy placed in scan, and patient's son notified that the letter was ready to be picked up.

## 2017-08-23 ENCOUNTER — Ambulatory Visit: Payer: Medicare Other | Admitting: Unknown Physician Specialty

## 2017-09-07 ENCOUNTER — Emergency Department: Payer: Medicare Other

## 2017-09-07 ENCOUNTER — Other Ambulatory Visit: Payer: Self-pay

## 2017-09-07 ENCOUNTER — Emergency Department
Admission: EM | Admit: 2017-09-07 | Discharge: 2017-09-07 | Disposition: A | Discharge status: Home / Self Care | Payer: Medicare Other | Attending: Emergency Medicine | Admitting: Emergency Medicine

## 2017-09-07 DIAGNOSIS — N3000 Acute cystitis without hematuria: Secondary | ICD-10-CM | POA: Diagnosis not present

## 2017-09-07 DIAGNOSIS — J449 Chronic obstructive pulmonary disease, unspecified: Secondary | ICD-10-CM | POA: Diagnosis not present

## 2017-09-07 DIAGNOSIS — M79604 Pain in right leg: Secondary | ICD-10-CM | POA: Diagnosis not present

## 2017-09-07 DIAGNOSIS — M25569 Pain in unspecified knee: Secondary | ICD-10-CM | POA: Diagnosis not present

## 2017-09-07 DIAGNOSIS — I10 Essential (primary) hypertension: Secondary | ICD-10-CM | POA: Diagnosis not present

## 2017-09-07 DIAGNOSIS — Z8673 Personal history of transient ischemic attack (TIA), and cerebral infarction without residual deficits: Secondary | ICD-10-CM | POA: Insufficient documentation

## 2017-09-07 DIAGNOSIS — F0390 Unspecified dementia without behavioral disturbance: Secondary | ICD-10-CM | POA: Insufficient documentation

## 2017-09-07 DIAGNOSIS — Z87891 Personal history of nicotine dependence: Secondary | ICD-10-CM | POA: Diagnosis not present

## 2017-09-07 DIAGNOSIS — E039 Hypothyroidism, unspecified: Secondary | ICD-10-CM | POA: Insufficient documentation

## 2017-09-07 DIAGNOSIS — M25561 Pain in right knee: Secondary | ICD-10-CM | POA: Diagnosis not present

## 2017-09-07 LAB — URINALYSIS, COMPLETE (UACMP) WITH MICROSCOPIC
Bilirubin Urine: NEGATIVE
Glucose, UA: NEGATIVE mg/dL
Ketones, ur: NEGATIVE mg/dL
Nitrite: NEGATIVE
Protein, ur: 30 mg/dL — AB
Specific Gravity, Urine: 1.016 (ref 1.005–1.030)
pH: 6 (ref 5.0–8.0)

## 2017-09-07 LAB — COMPREHENSIVE METABOLIC PANEL
ALBUMIN: 3.7 g/dL (ref 3.5–5.0)
ALK PHOS: 72 U/L (ref 38–126)
ALT: 15 U/L (ref 14–54)
ANION GAP: 9 (ref 5–15)
AST: 26 U/L (ref 15–41)
BILIRUBIN TOTAL: 0.7 mg/dL (ref 0.3–1.2)
BUN: 14 mg/dL (ref 6–20)
CALCIUM: 8.7 mg/dL — AB (ref 8.9–10.3)
CO2: 23 mmol/L (ref 22–32)
Chloride: 107 mmol/L (ref 101–111)
Creatinine, Ser: 0.91 mg/dL (ref 0.44–1.00)
GFR, EST NON AFRICAN AMERICAN: 58 mL/min — AB (ref >60)
GLUCOSE: 105 mg/dL — AB (ref 65–99)
Potassium: 3.5 mmol/L (ref 3.5–5.1)
Sodium: 139 mmol/L (ref 135–145)
TOTAL PROTEIN: 7.1 g/dL (ref 6.5–8.1)

## 2017-09-07 LAB — CBC WITH DIFFERENTIAL/PLATELET
Basophils Absolute: 0 10*3/uL (ref 0–0.1)
Basophils Relative: 1 %
Eosinophils Absolute: 0 10*3/uL (ref 0–0.7)
Eosinophils Relative: 0 %
HEMATOCRIT: 41 % (ref 35.0–47.0)
HEMOGLOBIN: 13.1 g/dL (ref 12.0–16.0)
LYMPHS ABS: 1.5 10*3/uL (ref 1.0–3.6)
LYMPHS PCT: 16 %
MCH: 27.3 pg (ref 26.0–34.0)
MCHC: 31.9 g/dL — ABNORMAL LOW (ref 32.0–36.0)
MCV: 85.8 fL (ref 80.0–100.0)
Monocytes Absolute: 0.5 10*3/uL (ref 0.2–0.9)
Monocytes Relative: 5 %
NEUTROS ABS: 7.1 10*3/uL — AB (ref 1.4–6.5)
NEUTROS PCT: 78 %
Platelets: 296 10*3/uL (ref 150–440)
RBC: 4.78 MIL/uL (ref 3.80–5.20)
RDW: 14.3 % (ref 11.5–14.5)
WBC: 9.1 10*3/uL (ref 3.6–11.0)

## 2017-09-07 MED ORDER — SODIUM CHLORIDE 0.9 % IV SOLN
1.0000 g | Freq: Once | INTRAVENOUS | Status: AC
  Administered 2017-09-07: 1 g via INTRAVENOUS
  Filled 2017-09-07: qty 10

## 2017-09-07 MED ORDER — ACETAMINOPHEN-CODEINE #3 300-30 MG PO TABS
1.0000 | ORAL_TABLET | Freq: Once | ORAL | Status: AC
  Administered 2017-09-07: 1 via ORAL
  Filled 2017-09-07: qty 1

## 2017-09-07 MED ORDER — CEPHALEXIN 250 MG PO CAPS: 250.0000 mg | ORAL_CAPSULE | Freq: Four times a day (QID) | ORAL | 0 refills | Status: AC

## 2017-09-07 NOTE — ED Provider Notes (Signed)
[facility] Emergency Department Provider Note       Time seen: ----------------------------------------- 7:07 AM on 09/07/2017 -----------------------------------------   I have reviewed the triage vital signs and the nursing notes.  HISTORY   Chief Complaint Leg Pain Level V caveat: History/ROS limited by altered mental status   HPI Allison Bowman is a 84 y.o. female with a history of anemia, arthritis, COPD, advanced dementia, GERD, hypertension and CVA who presents to the ED for right leg pain.  Patient arrives by EMS from home.  Son reports that she started having difficulty walking several days ago.  She has not had any significant recent falls.  She normally uses a walker and cannot bear weight on her right leg.  She is nonverbal at baseline.  Past Medical History:  Diagnosis Date  . Anemia   . Arthritis   . COPD (chronic obstructive pulmonary disease) (HCC)   . Dementia   . GERD (gastroesophageal reflux disease)   . Hearing loss   . HTN (hypertension)   . IFG (impaired fasting glucose)   . Osteoporosis   . Palpitations   . Scoliosis   . Stroke [facility])     Patient Active Problem List   Diagnosis Date Noted  . DNR (do not resuscitate) 11/23/2016  . Balance disorder 05/06/2016  . Underweight 10/01/2015  . Advanced directives, counseling/discussion 04/02/2015  . Dementia 12/26/2014  . Anemia 12/26/2014  . IFG (impaired fasting glucose) 12/26/2014  . Chronic pain syndrome 12/26/2014  . Heart murmur 12/26/2014  . CVA (cerebral infarction) 12/26/2014  . Hypothyroidism 12/26/2014  . Osteoarthritis 12/26/2014  . HTN (hypertension) 11/30/2014  . Closed left hip fracture (HCC) 11/30/2014  . Hip fracture (HCC) 11/30/2014  . Gastroesophageal reflux disease without esophagitis 11/30/2014  . Stroke (HCC) 11/30/2014  . COPD (chronic obstructive pulmonary disease) (HCC) 11/30/2014  . Urinary retention     Past Surgical History:  Procedure  Laterality Date  . HIP SURGERY Left   . INTRAMEDULLARY (IM) NAIL INTERTROCHANTERIC Left 11/30/2014   Procedure: INTRAMEDULLARY (IM) NAIL INTERTROCHANTRIC Left short ;  Surgeon: Deeann Saint, MD;  Location: [facility] ORS;  Service: Orthopedics;  Laterality: Left;    Allergies Lovastatin  Social History Social History   Tobacco Use  . Smoking status: Former Smoker    Years: 53.00    Types: Cigarettes    Last attempt to quit: 01/09/2006    Years since quitting: 11.6  . Smokeless tobacco: Never Used  Substance Use Topics  . Alcohol use: No    Alcohol/week: 0.0 oz  . Drug use: No   Review of Systems Positive for right leg pain  All systems unknown except as stated in the HPI  ____________________________________________   PHYSICAL EXAM:  VITAL SIGNS: ED Triage Vitals [09/07/17 0629]  Enc Vitals Group     BP (!) 169/86     Pulse Rate (!) 106     Resp (!) 24     Temp 98.3 F (36.8 C)     Temp Source Oral     SpO2 96 %     Weight 86 lb (39 kg)     Height 5' (1.524 m)     Head Circumference      Peak Flow      Pain Score      Pain Loc      Pain Edu?      Excl. in GC?    Constitutional: Alert and oriented. Well appearing and in no distress. Eyes: Conjunctivae are  normal. Normal extraocular movements. Cardiovascular: Normal rate, regular rhythm.  Systolic murmur Respiratory: Normal respiratory effort without tachypnea nor retractions. Breath sounds are clear and equal bilaterally. No wheezes/rales/rhonchi. Gastrointestinal: Soft and nontender. Normal bowel sounds Musculoskeletal:  Neurologic:  Normal speech and language. No gross focal neurologic deficits are appreciated.  Skin:  Skin is warm, dry and intact. No rash noted. Psychiatric: Mood and affect are normal. Speech and behavior are normal.  ____________________________________________  ED COURSE:  As part of my medical decision making, I reviewed the following data within the electronic MEDICAL RECORD NUMBER History  obtained from family if available, nursing notes, old chart and ekg, as well as notes from prior ED visits. Patient presented for right leg pain, we will assess with imaging as indicated at this time. Clinical Course as of Sep 07 1009  Tue Sep 07, 2017  0759 ABIs revealed mild vascular disease but doubtful as to the cause of her leg pain   [JW]    Clinical Course User Index [JW] Emily FilbertWilliams, Samul Mcinroy E, MD   Procedures ____________________________________________   LABS (pertinent positives/negatives)  Labs Reviewed  URINALYSIS, COMPLETE (UACMP) WITH MICROSCOPIC - Abnormal; Notable for the following components:      Result Value   Color, Urine AMBER (*)    APPearance CLOUDY (*)    Hgb urine dipstick SMALL (*)    Protein, ur 30 (*)    Leukocytes, UA LARGE (*)    Bacteria, UA MANY (*)    Squamous Epithelial / LPF 0-5 (*)    All other components within normal limits  CBC WITH DIFFERENTIAL/PLATELET - Abnormal; Notable for the following components:   MCHC 31.9 (*)    Neutro Abs 7.1 (*)    All other components within normal limits  COMPREHENSIVE METABOLIC PANEL - Abnormal; Notable for the following components:   Glucose, Bld 105 (*)    Calcium 8.7 (*)    GFR calc non Af Amer 58 (*)    All other components within normal limits  URINE CULTURE    RADIOLOGY Images were viewed by me  Right hip, right knee Reveal arthritis but are otherwise unremarkable Right lower extreme the ultrasound is negative for DVT ____________________________________________  DIFFERENTIAL DIAGNOSIS   Contusion, fracture, dislocation, arthritis  FINAL ASSESSMENT AND PLAN  Right leg pain, UTI   Plan: The patient had presented for right leg pain. Patient's imaging was negative for any acute process.  She was having frequent urination while in the room and was found to have a UTI.  We sent a urine culture and give IV Rocephin.  She will be discharged with Keflex and she is stable for outpatient  follow-up.  No clear etiology for her right leg pain.   Ulice DashJohnathan E Rafan Sanders, MD   Note: This note was generated in part or whole with voice recognition software. Voice recognition is usually quite accurate but there are transcription errors that can and very often do occur. I apologize for any typographical errors that were not detected and corrected.     Emily FilbertWilliams, Mannie Wineland E, MD 09/07/17 1014

## 2017-09-07 NOTE — ED Notes (Addendum)
BP: L arm - 167/81 R arm - 168/91  L leg - 143/97 R leg - 126/61

## 2017-09-07 NOTE — ED Triage Notes (Addendum)
Pt arrived per EMS from home with complaints of right Leg pain. Son reported to EMS that she started having difficulty ambulating a couple of days ago. He stated she has had falls in past. Pt uses a walker and is not baring weight on her right leg anymore. Pt is non-verbal. Pt has a Hx of Advanced Dementia. Son is her care taker and POA. Pt has a DNR and medication at bedside. VS per EMS BP-170/76 HR-100 R-12 Pt alert

## 2017-09-07 NOTE — ED Notes (Signed)
Pt with family and verbalizes d/c teaching and follow up with rx.. PT in NAD, WC to lobby

## 2017-09-09 ENCOUNTER — Telehealth: Payer: Self-pay | Admitting: Unknown Physician Specialty

## 2017-09-09 DIAGNOSIS — R2689 Other abnormalities of gait and mobility: Secondary | ICD-10-CM

## 2017-09-09 DIAGNOSIS — R4182 Altered mental status, unspecified: Secondary | ICD-10-CM

## 2017-09-09 LAB — URINE CULTURE
Culture: 40000 — AB
Special Requests: NORMAL

## 2017-09-09 NOTE — Telephone Encounter (Signed)
ok 

## 2017-09-09 NOTE — Telephone Encounter (Signed)
Spoke with Dr.Johnson, a referral can be placed to home health as long as patient is seen within the next 30 days. Patient has follow up appt scheduled with Gabriel Cirriheryl Wicker, NP on 09/14/2017. Spoke with Mr.Kamiya about importance of coming to this appointment. His biggest concern is transporting her due to her weakness. Discussed with patient that the antibiotic she is on should start helping her feel better but if he needs her transported on Tuesday we can call for an ambulance to bring her. Mr.Pal will call Monday afternoon to let us know how she is doing and if she needs an ambulance for transport to office. Informed Mr.Kassab to call interm if needed. Routed note to Dr.Johnson for urgent referral to home health.

## 2017-09-09 NOTE — Telephone Encounter (Signed)
Referral generated. Needs to keep that appointment with PCP on Tuesday

## 2017-09-09 NOTE — Telephone Encounter (Signed)
Allison Bowman came in and stated that he would like to have home health come out and help him with his mom. He would like a call back at [phone].

## 2017-09-09 NOTE — Telephone Encounter (Signed)
Patient has not been seen since September 2018. Home Health will not do w/o face to face.

## 2017-09-09 NOTE — Telephone Encounter (Signed)
Referral was submitted to [facility], Contacted Allison Bowman to ask her to start the process.

## 2017-09-10 DIAGNOSIS — F0390 Unspecified dementia without behavioral disturbance: Secondary | ICD-10-CM | POA: Diagnosis not present

## 2017-09-10 DIAGNOSIS — M1711 Unilateral primary osteoarthritis, right knee: Secondary | ICD-10-CM | POA: Diagnosis not present

## 2017-09-10 DIAGNOSIS — J449 Chronic obstructive pulmonary disease, unspecified: Secondary | ICD-10-CM | POA: Diagnosis not present

## 2017-09-10 DIAGNOSIS — G894 Chronic pain syndrome: Secondary | ICD-10-CM | POA: Diagnosis not present

## 2017-09-10 DIAGNOSIS — I10 Essential (primary) hypertension: Secondary | ICD-10-CM | POA: Diagnosis not present

## 2017-09-10 DIAGNOSIS — R338 Other retention of urine: Secondary | ICD-10-CM | POA: Diagnosis not present

## 2017-09-10 DIAGNOSIS — Z87310 Personal history of (healed) osteoporosis fracture: Secondary | ICD-10-CM | POA: Diagnosis not present

## 2017-09-10 DIAGNOSIS — M1611 Unilateral primary osteoarthritis, right hip: Secondary | ICD-10-CM | POA: Diagnosis not present

## 2017-09-10 DIAGNOSIS — E039 Hypothyroidism, unspecified: Secondary | ICD-10-CM | POA: Diagnosis not present

## 2017-09-10 DIAGNOSIS — Z9181 History of falling: Secondary | ICD-10-CM | POA: Diagnosis not present

## 2017-09-10 DIAGNOSIS — D638 Anemia in other chronic diseases classified elsewhere: Secondary | ICD-10-CM | POA: Diagnosis not present

## 2017-09-10 DIAGNOSIS — N3000 Acute cystitis without hematuria: Secondary | ICD-10-CM | POA: Diagnosis not present

## 2017-09-10 DIAGNOSIS — K219 Gastro-esophageal reflux disease without esophagitis: Secondary | ICD-10-CM | POA: Diagnosis not present

## 2017-09-10 DIAGNOSIS — Z87891 Personal history of nicotine dependence: Secondary | ICD-10-CM | POA: Diagnosis not present

## 2017-09-10 DIAGNOSIS — Z8673 Personal history of transient ischemic attack (TIA), and cerebral infarction without residual deficits: Secondary | ICD-10-CM | POA: Diagnosis not present

## 2017-09-10 DIAGNOSIS — Z792 Long term (current) use of antibiotics: Secondary | ICD-10-CM | POA: Diagnosis not present

## 2017-09-13 ENCOUNTER — Telehealth: Payer: Self-pay | Admitting: Unknown Physician Specialty

## 2017-09-13 DIAGNOSIS — J449 Chronic obstructive pulmonary disease, unspecified: Secondary | ICD-10-CM | POA: Diagnosis not present

## 2017-09-13 DIAGNOSIS — M1711 Unilateral primary osteoarthritis, right knee: Secondary | ICD-10-CM | POA: Diagnosis not present

## 2017-09-13 DIAGNOSIS — N3000 Acute cystitis without hematuria: Secondary | ICD-10-CM | POA: Diagnosis not present

## 2017-09-13 DIAGNOSIS — I10 Essential (primary) hypertension: Secondary | ICD-10-CM | POA: Diagnosis not present

## 2017-09-13 DIAGNOSIS — F0390 Unspecified dementia without behavioral disturbance: Secondary | ICD-10-CM | POA: Diagnosis not present

## 2017-09-13 DIAGNOSIS — M1611 Unilateral primary osteoarthritis, right hip: Secondary | ICD-10-CM | POA: Diagnosis not present

## 2017-09-13 NOTE — Telephone Encounter (Signed)
Routing to provider  

## 2017-09-13 NOTE — Telephone Encounter (Signed)
Copied from CRM [id number]. Topic: Quick Communication - See Telephone Encounter >> Sep 13, 2017  3:34 PM Kimani Hovis, Candelaria ArenasKelly, [place] wrote: Reason for CRM: Eber JonesCarolyn is calling because she would like to have verbal  orders for home physical therapy 2 times a week for the next 6 weeks and she also needs an order for a transport chair for the patient. If someone could give her a call back at [phone]

## 2017-09-13 NOTE — Telephone Encounter (Signed)
Verbal orders given and may need to send an order in for transport chair.  Typically it will need an office visit

## 2017-09-14 ENCOUNTER — Telehealth: Payer: Self-pay | Admitting: Unknown Physician Specialty

## 2017-09-14 ENCOUNTER — Encounter: Payer: Self-pay | Admitting: Unknown Physician Specialty

## 2017-09-14 ENCOUNTER — Ambulatory Visit (INDEPENDENT_AMBULATORY_CARE_PROVIDER_SITE_OTHER): Payer: Medicare Other | Admitting: Unknown Physician Specialty

## 2017-09-14 VITALS — BP 140/70 | HR 76 | Temp 96.4°F | Ht <= 58 in | Wt 79.4 lb

## 2017-09-14 DIAGNOSIS — Z66 Do not resuscitate: Secondary | ICD-10-CM

## 2017-09-14 DIAGNOSIS — N3000 Acute cystitis without hematuria: Secondary | ICD-10-CM | POA: Diagnosis not present

## 2017-09-14 DIAGNOSIS — Z7409 Other reduced mobility: Secondary | ICD-10-CM | POA: Insufficient documentation

## 2017-09-14 DIAGNOSIS — J449 Chronic obstructive pulmonary disease, unspecified: Secondary | ICD-10-CM | POA: Diagnosis not present

## 2017-09-14 DIAGNOSIS — Z7189 Other specified counseling: Secondary | ICD-10-CM

## 2017-09-14 DIAGNOSIS — M1611 Unilateral primary osteoarthritis, right hip: Secondary | ICD-10-CM | POA: Diagnosis not present

## 2017-09-14 DIAGNOSIS — R636 Underweight: Secondary | ICD-10-CM

## 2017-09-14 DIAGNOSIS — M1711 Unilateral primary osteoarthritis, right knee: Secondary | ICD-10-CM | POA: Diagnosis not present

## 2017-09-14 DIAGNOSIS — R2689 Other abnormalities of gait and mobility: Secondary | ICD-10-CM | POA: Diagnosis not present

## 2017-09-14 DIAGNOSIS — F0391 Unspecified dementia with behavioral disturbance: Secondary | ICD-10-CM | POA: Diagnosis not present

## 2017-09-14 DIAGNOSIS — I10 Essential (primary) hypertension: Secondary | ICD-10-CM | POA: Diagnosis not present

## 2017-09-14 DIAGNOSIS — F0390 Unspecified dementia without behavioral disturbance: Secondary | ICD-10-CM | POA: Diagnosis not present

## 2017-09-14 DIAGNOSIS — R627 Adult failure to thrive: Secondary | ICD-10-CM | POA: Diagnosis not present

## 2017-09-14 NOTE — Assessment & Plan Note (Signed)
See most form

## 2017-09-14 NOTE — Telephone Encounter (Signed)
Called and let Allison Bowman know that Allison Bowman Allison Bowman gave the OK for verbal orders. Asked for transport chair order to be faxed to [phone].

## 2017-09-14 NOTE — Telephone Encounter (Signed)
Copied from CRM [id number]. Topic: Quick Communication - See Telephone Encounter >> Sep 13, 2017  3:34 PM Moton, FerneyKelly, [place] wrote: Reason for CRM: Eber JonesCarolyn is calling because she would like to have verbal  orders for home physical therapy 2 times a week for the next 6 weeks and she also needs an order for a transport chair for the patient. If someone could give her a call back at [phone] >> Sep 14, 2017  1:51 PM Rudi CocoLathan, Gabriele Zwilling M, [place] wrote: Stanton Kidneyebra calling from [facility] to let Mel AlmondJada know that she will be faxing another form for terminal illness (an old form was sent). Also needing a MD to sign form as well as NP. Also she stated that it would need to list the type of dementia . Stanton KidneyDebra can be reached at [phone]

## 2017-09-14 NOTE — Telephone Encounter (Signed)
Order for transport chair signed by Elnita Maxwellheryl and faxed as requested along with OV note from today.

## 2017-09-14 NOTE — Assessment & Plan Note (Signed)
Pt with dementia and significant weight loss. Son needs additional support as unable to afford nutritional supplements.  Hospice plus social work will be consulted.

## 2017-09-14 NOTE — Assessment & Plan Note (Addendum)
Pt is continuing to lose weight.  Discussed hospice referral.  Son agrees

## 2017-09-14 NOTE — Assessment & Plan Note (Addendum)
Face to face discussion about goals of care.  Most form discussed, filled out, given to patient, and scanned in the chart.  Agrees to check if qualified for hospice care Discussion with son lasted 20 minutes.

## 2017-09-14 NOTE — Assessment & Plan Note (Signed)
Improving following UTI. Recheck urine today

## 2017-09-14 NOTE — Assessment & Plan Note (Signed)
Significant problems ambulating with walker.  Order for wheelchair for transport.  May be able to be obtained through hospice if she qualifies.

## 2017-09-14 NOTE — Assessment & Plan Note (Signed)
Increased problem with ambulation.  At risk for falls.  Getting home PT

## 2017-09-14 NOTE — Assessment & Plan Note (Signed)
Improved to 140/80 after sitting for a period of time.  Will continue present meds and not increase at this time due to risk of Orthostasis.

## 2017-09-14 NOTE — Progress Notes (Signed)
BP 140/70   Pulse 76   Temp (!) 96.4 F (35.8 C) (Tympanic)   Ht 4\' 8"  (1.422 m)   Wt 79 lb 6.4 oz (36 kg)   LMP  (LMP Unknown)   SpO2 98%   BMI 17.80 kg/m    Subjective:    Patient ID: Allison Bowman, female    DOB: Nov 06, 1933, 84 y.o.   MRN: [id number]  HPI: Allison Bowman is a 84 y.o. female  Chief Complaint  Patient presents with  . Dementia  . Hypertension  . Transport Chair    home health requesting order for transport chair    Pt here with her son who is currently frustrated.  He is overwhelmed with caregiving duties.  He needs to get back to work but unable.    Went to the ER last week (3/26)  due to right leg pain and a UTI.  X-ray and [place] were normal.  Continuing to have difficulty walking.  Using a walker but barely able to move from one place to another.  Takes 10-15 minutes to get to the car.  Difficulty getting around the house.  She is currently full care.  PT is now out to see her.  Son finds that there are times she can't walk at all.  Some improvement following UTI    Relevant past medical, surgical, family and social history reviewed and updated as indicated. Interim medical history since our last visit reviewed. Allergies and medications reviewed and updated.  Review of Systems  Per HPI unless specifically indicated above     Objective:    BP 140/70   Pulse 76   Temp (!) 96.4 F (35.8 C) (Tympanic)   Ht 4\' 8"  (1.422 m)   Wt 79 lb 6.4 oz (36 kg)   LMP  (LMP Unknown)   SpO2 98%   BMI 17.80 kg/m   Wt Readings from Last 3 Encounters:  09/14/17 79 lb 6.4 oz (36 kg)  09/07/17 86 lb (39 kg)  02/23/17 86 lb (39 kg)    Physical Exam  Constitutional: She appears listless. She is easily aroused.  Non-toxic appearance. She has a sickly appearance.  Cardiovascular: Normal rate and regular rhythm.  Pulmonary/Chest: No accessory muscle usage. No respiratory distress. She has no decreased breath sounds.  Neurological: She is easily aroused. She appears  listless.  Skin: Skin is warm and dry.  Psychiatric: Her mood appears not anxious. Cognition and memory are impaired. She does not exhibit a depressed mood. She is noncommunicative.     Assessment & Plan:   Problem List Items Addressed This Visit      Unprioritized   Advanced directives, counseling/discussion    Face to face discussion about goals of care.  Most form discussed, filled out, given to patient, and scanned in the chart.  Agrees to check if qualified for hospice care Discussion with son lasted 20 minutes.        Balance disorder    Increased problem with ambulation.  At risk for falls.  Getting home PT      Relevant Orders   Ambulatory referral to Connected Care   Dementia - Primary    Improving following UTI. Recheck urine today      Relevant Orders   Ambulatory referral to Connected Care   Ambulatory referral to Hospice   DNR (do not resuscitate)    See most form      Failure to thrive in adult    Pt with dementia and  significant weight loss. Son needs additional support as unable to afford nutritional supplements.  Hospice plus social work will be consulted.        Relevant Orders   Ambulatory referral to Hospice   HTN (hypertension)    Improved to 140/80 after sitting for a period of time.  Will continue present meds and not increase at this time due to risk of Orthostasis.        Underweight    Pt is continuing to lose weight.  Discussed hospice referral.  Son agrees      Relevant Orders   Ambulatory referral to Hospice   Very poor mobility    Significant problems ambulating with walker.  Order for wheelchair for transport.  May be able to be obtained through hospice if she qualifies.         Other Visit Diagnoses    Acute cystitis without hematuria       Relevant Orders   UA/M w/rflx Culture, Routine      Labs completed in the hospital.    Follow up plan: Return in about 2 weeks (around 09/28/2017).

## 2017-09-14 NOTE — Telephone Encounter (Signed)
Outreach call to pt's son Molly MaduroRobert to discuss [employer] resource referral [facility]. knb

## 2017-09-15 ENCOUNTER — Other Ambulatory Visit: Payer: Self-pay

## 2017-09-15 ENCOUNTER — Telehealth: Payer: Self-pay | Admitting: Unknown Physician Specialty

## 2017-09-15 MED ORDER — ATENOLOL 25 MG PO TABS: 25.0000 mg | ORAL_TABLET | Freq: Every day | ORAL | 1 refills | Status: AC

## 2017-09-15 NOTE — Telephone Encounter (Signed)
Patient needs a refill on her atenolol  90 day supply sent to [facility] [address]   Thank you

## 2017-09-15 NOTE — Telephone Encounter (Signed)
Copied from CRM [id number]. Topic: Inquiry >> Sep 15, 2017 11:33 AM Stephannie LiSimmons, Janett L, NT wrote: Reason for CRM: [facility] called to follow up on fax sent for the patient please call Debra at  [phone]  -------------------------------------------------------------  Do we need to do anything with this?

## 2017-09-15 NOTE — Patient Outreach (Signed)
[facility]) Care Management  09/15/2017  Bonnye FavaGloria Ellingsen 08-18-1933 [id number]   Telephone Screen  Referral Date: 09/14/17 Referral Source: MD office Gabriel Cirri(Cheryl Wicker, NP) Referral Reason: " financial assistance, Rx assistance(trouble paying for Advair), help with personal care, respite care, food bank assistance, behind on lot payment for trailer for two months owes $150.00" Insurance: Medicare   Outreach attempt # 1 to patient/son. Spoke with son (ROI on file). He reports that he is driving unable to complete screening call at this time. Advised that RN CM would call back at another time.      Plan: RN CM will make outreach attempt to pt/son within 3-4 business days.    Antionette Fairyoshanda Jerrett Baldinger, RN,BSN,CCM [facility] Care Management Telephonic Care Management Coordinator Direct Phone: [phone] Toll Free: [phone] Fax: [phone]

## 2017-09-16 ENCOUNTER — Telehealth: Payer: Self-pay | Admitting: Unknown Physician Specialty

## 2017-09-16 ENCOUNTER — Other Ambulatory Visit: Payer: Self-pay

## 2017-09-16 DIAGNOSIS — N3000 Acute cystitis without hematuria: Secondary | ICD-10-CM | POA: Diagnosis not present

## 2017-09-16 DIAGNOSIS — I10 Essential (primary) hypertension: Secondary | ICD-10-CM | POA: Diagnosis not present

## 2017-09-16 DIAGNOSIS — J449 Chronic obstructive pulmonary disease, unspecified: Secondary | ICD-10-CM | POA: Diagnosis not present

## 2017-09-16 DIAGNOSIS — M1711 Unilateral primary osteoarthritis, right knee: Secondary | ICD-10-CM | POA: Diagnosis not present

## 2017-09-16 DIAGNOSIS — F0390 Unspecified dementia without behavioral disturbance: Secondary | ICD-10-CM | POA: Diagnosis not present

## 2017-09-16 DIAGNOSIS — M1611 Unilateral primary osteoarthritis, right hip: Secondary | ICD-10-CM | POA: Diagnosis not present

## 2017-09-16 MED ORDER — NITROFURANTOIN MONOHYD MACRO 100 MG PO CAPS: 100.0000 mg | ORAL_CAPSULE | Freq: Two times a day (BID) | ORAL | 0 refills | Status: DC

## 2017-09-16 NOTE — Telephone Encounter (Signed)
Called and spoke to BataviaDenise, she stated that Stanton KidneyDebra was on another call. I let her know that Stanton KidneyDebra was wanting to f/up on a fax that she sent. I let her know that we received the fax and I am waiting on a provider signature before I fax it back. I told her that the provider would be back in the morning and I would fax the form then. Angelique BlonderDenise stated that she would let Stanton KidneyDebra know.

## 2017-09-16 NOTE — Telephone Encounter (Signed)
Verbal orders OK?

## 2017-09-16 NOTE — Telephone Encounter (Signed)
Toni aware. Will notify pt's son.

## 2017-09-16 NOTE — Telephone Encounter (Signed)
Sent in Macrobid

## 2017-09-16 NOTE — Telephone Encounter (Signed)
Called and spoke to [place]. States patient can barely walk, stated "and it's messing with her mind". Would like another antibiotic sent in today. She took last Keflex on this morning.  Please advise.

## 2017-09-16 NOTE — Telephone Encounter (Signed)
Could someone please give [facility] regarding the patient is very weak, worse today than yesterday with the UTI. She has finished her treatment for the UTI and son brought in specimen yesterday.   She also wanted to get someone to please do the wheelchair request.  Thanks  Allison Bowman [phone]

## 2017-09-16 NOTE — Telephone Encounter (Signed)
New form is on Brittany's desk awaiting Cheryl's signature. Please advise on verbal orders.

## 2017-09-16 NOTE — Telephone Encounter (Signed)
Talked to son Molly MaduroRobert.  New antibiotic sent in

## 2017-09-16 NOTE — Patient Outreach (Signed)
[facility]) Care Management  09/16/2017  Allison Bowman June 20, 1933 [id number]   Telephone Screen  Referral Date: 09/14/17 Referral Source: MD office Gabriel Cirri(Cheryl Wicker, NP) Referral Reason: " financial assistance, Rx assistance(trouble paying for Advair), help with personal care, respite care, food bank assistance, behind on lot payment for trailer for two months owes $150.00" Insurance: Medicare   Outreach attempt #2 to patient's son-[name]. Son instantly voices that he is a "very direct and blunt person." Then immediately proceeds to speak extremely loud regarding his concerns about getting "all these calls from folks trying to help but no one is helping them." He confirmed that [facility] and completed initial assessment. He then stated that they are "not doing anything" as they only came out and "asked questions." Advised son that this is required in order to develop a plan of care, get approval of how many visits needed and etc. He was not satisfied with this answer. He states that PCP reccommended Hospice as well but he does not know if he wants to proceed that route. RN CM attempted to explain [facility] services to son. However, he continued to get loud and somewhat upset stating "sounds like you don't do anything" as he was upset that our staff does not provide personal care to patient. Son continued to go on and on about how too many people are trying to help but nothing is being accomplished. He voices he has been waiting "nine days" for a wheelchair for patient.Advised patient that medical equipment requires a script from MD, insurance approval and finding a supplier that accepts insurance. He again did not seem satisfied with answer. Son did not seem interested in [facility] services at this time. Advised him that RN CM would send out [facility] brochure and magnet for him to review which further explains [facility] services and for him to feel free to call office if  interested in the future. He was agreeable to this plan.     Plan: RN CM will close case at this time. RN CM will send [facility] patient brochure and info via mail. RN CM will send MD case closure letter.     Antionette Fairyoshanda Yannick Steuber, RN,BSN,CCM [facility] Care Management Telephonic Care Management Coordinator Direct Phone: [phone] Toll Free: [phone] Fax: [phone]

## 2017-09-16 NOTE — Telephone Encounter (Signed)
Called and left Eber JonesCarolyn a message to return call as vm box only had number on it.

## 2017-09-18 LAB — URINE CULTURE, REFLEX

## 2017-09-18 LAB — MICROSCOPIC EXAMINATION

## 2017-09-18 LAB — UA/M W/RFLX CULTURE, ROUTINE
Bilirubin, UA: NEGATIVE
GLUCOSE, UA: NEGATIVE
Ketones, UA: NEGATIVE
NITRITE UA: NEGATIVE
SPEC GRAV UA: 1.025 (ref 1.005–1.030)
UUROB: 0.2 mg/dL (ref 0.2–1.0)
pH, UA: 5.5 (ref 5.0–7.5)

## 2017-09-20 ENCOUNTER — Other Ambulatory Visit: Payer: Self-pay | Admitting: Unknown Physician Specialty

## 2017-09-20 DIAGNOSIS — J449 Chronic obstructive pulmonary disease, unspecified: Secondary | ICD-10-CM | POA: Diagnosis not present

## 2017-09-20 DIAGNOSIS — F0390 Unspecified dementia without behavioral disturbance: Secondary | ICD-10-CM | POA: Diagnosis not present

## 2017-09-20 DIAGNOSIS — N3000 Acute cystitis without hematuria: Secondary | ICD-10-CM | POA: Diagnosis not present

## 2017-09-20 DIAGNOSIS — I10 Essential (primary) hypertension: Secondary | ICD-10-CM | POA: Diagnosis not present

## 2017-09-20 DIAGNOSIS — M1611 Unilateral primary osteoarthritis, right hip: Secondary | ICD-10-CM | POA: Diagnosis not present

## 2017-09-20 DIAGNOSIS — M1711 Unilateral primary osteoarthritis, right knee: Secondary | ICD-10-CM | POA: Diagnosis not present

## 2017-09-20 MED ORDER — CIPROFLOXACIN HCL 250 MG PO TABS: 250.0000 mg | ORAL_TABLET | Freq: Two times a day (BID) | ORAL | 0 refills | Status: DC

## 2017-09-20 NOTE — Progress Notes (Signed)
Ab changed due to culture results

## 2017-09-23 DIAGNOSIS — I10 Essential (primary) hypertension: Secondary | ICD-10-CM | POA: Diagnosis not present

## 2017-09-23 DIAGNOSIS — N3000 Acute cystitis without hematuria: Secondary | ICD-10-CM | POA: Diagnosis not present

## 2017-09-23 DIAGNOSIS — M1611 Unilateral primary osteoarthritis, right hip: Secondary | ICD-10-CM | POA: Diagnosis not present

## 2017-09-23 DIAGNOSIS — M1711 Unilateral primary osteoarthritis, right knee: Secondary | ICD-10-CM | POA: Diagnosis not present

## 2017-09-23 DIAGNOSIS — F0390 Unspecified dementia without behavioral disturbance: Secondary | ICD-10-CM | POA: Diagnosis not present

## 2017-09-23 DIAGNOSIS — J449 Chronic obstructive pulmonary disease, unspecified: Secondary | ICD-10-CM | POA: Diagnosis not present

## 2017-09-27 ENCOUNTER — Ambulatory Visit (INDEPENDENT_AMBULATORY_CARE_PROVIDER_SITE_OTHER): Payer: Medicare Other | Admitting: Unknown Physician Specialty

## 2017-09-27 ENCOUNTER — Encounter: Payer: Self-pay | Admitting: Unknown Physician Specialty

## 2017-09-27 VITALS — BP 139/82 | HR 82 | Temp 97.7°F | Ht <= 58 in | Wt 83.3 lb

## 2017-09-27 DIAGNOSIS — N390 Urinary tract infection, site not specified: Secondary | ICD-10-CM | POA: Diagnosis not present

## 2017-09-27 DIAGNOSIS — R627 Adult failure to thrive: Secondary | ICD-10-CM | POA: Diagnosis not present

## 2017-09-27 DIAGNOSIS — R600 Localized edema: Secondary | ICD-10-CM

## 2017-09-27 DIAGNOSIS — F0280 Dementia in other diseases classified elsewhere without behavioral disturbance: Secondary | ICD-10-CM

## 2017-09-27 DIAGNOSIS — N3000 Acute cystitis without hematuria: Secondary | ICD-10-CM

## 2017-09-27 DIAGNOSIS — R011 Cardiac murmur, unspecified: Secondary | ICD-10-CM

## 2017-09-27 DIAGNOSIS — G301 Alzheimer's disease with late onset: Secondary | ICD-10-CM | POA: Diagnosis not present

## 2017-09-27 LAB — UA/M W/RFLX CULTURE, ROUTINE
Bilirubin, UA: NEGATIVE
Glucose, UA: NEGATIVE
KETONES UA: NEGATIVE
LEUKOCYTES UA: NEGATIVE
NITRITE UA: NEGATIVE
SPEC GRAV UA: 1.02 (ref 1.005–1.030)
Urobilinogen, Ur: 2 mg/dL — ABNORMAL HIGH (ref 0.2–1.0)
pH, UA: 6.5 (ref 5.0–7.5)

## 2017-09-27 LAB — MICROSCOPIC EXAMINATION: BACTERIA UA: NONE SEEN

## 2017-09-27 NOTE — Progress Notes (Signed)
BP 139/82 (BP Location: Left Arm, Cuff Size: Small)   Pulse 82   Temp 97.7 F (36.5 C) (Oral)   Ht 4\' 8"  (1.422 m)   Wt 83 lb 4.8 oz (37.8 kg)   LMP  (LMP Unknown)   SpO2 96%   BMI 18.68 kg/m    Subjective:    Patient ID: Allison Bowman, female    DOB: Feb 11, 1934, 84 y.o.   MRN: [id number]  HPI: Allison Bowman is a 84 y.o. female  Chief Complaint  Patient presents with  . Dementia    2 week f/up   Pt is here to f/u weight loss and UTI along with general decline.  She is getting home health PT and nursing.  We are waiting Medicaid assistance.  Hospice has called and son is willing to accept their assistance.  She has improved about 50%  Ankle swelling Noticed in the last week. Keeping feet propped helps.  Right greater than left.  No SOB, fever.  No erythema  Relevant past medical, surgical, family and social history reviewed and updated as indicated. Interim medical history since our last visit reviewed. Allergies and medications reviewed and updated.  Review of Systems  Constitutional: Negative for activity change.  Respiratory: Negative for shortness of breath.   Cardiovascular: Negative for chest pain.  Gastrointestinal: Negative.   Neurological: Negative for facial asymmetry.    Per HPI unless specifically indicated above     Objective:    BP 139/82 (BP Location: Left Arm, Cuff Size: Small)   Pulse 82   Temp 97.7 F (36.5 C) (Oral)   Ht 4\' 8"  (1.422 m)   Wt 83 lb 4.8 oz (37.8 kg)   LMP  (LMP Unknown)   SpO2 96%   BMI 18.68 kg/m   Wt Readings from Last 3 Encounters:  09/27/17 83 lb 4.8 oz (37.8 kg)  09/14/17 79 lb 6.4 oz (36 kg)  09/07/17 86 lb (39 kg)    Physical Exam  Constitutional: Vital signs are normal. She appears ill. No distress.  Chronically ill appearing  HENT:  Head: Normocephalic and atraumatic.  Eyes: Conjunctivae and lids are normal. Right eye exhibits no discharge. Left eye exhibits no discharge. No scleral icterus.    Cardiovascular: Normal rate.  Murmur heard.  Systolic murmur is present with a grade of 3/6. Pulmonary/Chest: Effort normal.  Abdominal: Normal appearance. There is no splenomegaly or hepatomegaly.  Musculoskeletal: Normal range of motion.  Skin: Skin is intact. No rash noted. No pallor.  Psychiatric: Judgment normal. She is slowed and withdrawn. Cognition and memory are impaired.    Results for orders placed or performed in visit on 09/14/17  Microscopic Examination  Result Value Ref Range   WBC, UA >30 (A) 0 - 5 /hpf   RBC, UA 0-2 0 - 2 /hpf   Epithelial Cells (non renal) 0-10 0 - 10 /hpf   Bacteria, UA Few None seen/Few   Yeast, UA Present None seen  Urine Culture, Reflex  Result Value Ref Range   Urine Culture, Routine Final report (A)    Organism ID, Bacteria Comment (A)    Antimicrobial Susceptibility Comment   UA/M w/rflx Culture, Routine  Result Value Ref Range   Specific Gravity, UA 1.025 1.005 - 1.030   pH, UA 5.5 5.0 - 7.5   Color, UA Yellow Yellow   Appearance Ur Hazy (A) Clear   Leukocytes, UA 1+ (A) Negative   Protein, UA 2+ (A) Negative/Trace   Glucose, UA Negative Negative  Ketones, UA Negative Negative   RBC, UA Trace (A) Negative   Bilirubin, UA Negative Negative   Urobilinogen, Ur 0.2 0.2 - 1.0 mg/dL   Nitrite, UA Negative Negative   Microscopic Examination See below:    Urinalysis Reflex Comment       Assessment & Plan:   Problem List Items Addressed This Visit      Unprioritized   Dementia    This is progressive.  Hospice evaluation is pending. Goals of care reviewed with pt's son.  He agrees to no chest compressions or respirator.        Failure to thrive in adult   Heart murmur   Relevant Orders   DG Chest 2 View   Pedal edema    Right greater than left.  New symptom.  With heart murmer.  Get chest x-ray.  ? Doing vascular studies.        Relevant Orders   DG Chest 2 View    Other Visit Diagnoses    Recurrent UTI    -  Primary    Relevant Orders   UA/M w/rflx Culture, Routine   Acute cystitis without hematuria       Culture results show UTI is sensitive to Cipro.  Gave a urine cup to recheck at home and will bring in specimen       Follow up plan: Return in about 1 month (around 10/25/2017).

## 2017-09-27 NOTE — Addendum Note (Signed)
Addended by: Bascom LevelsPERRY, Amiylah Anastos R on: 09/27/2017 02:34 PM   Modules accepted: Orders

## 2017-09-27 NOTE — Assessment & Plan Note (Addendum)
This is progressive.  Hospice evaluation is pending. Goals of care reviewed with pt's son.  He agrees to no chest compressions or respirator.

## 2017-09-27 NOTE — Assessment & Plan Note (Signed)
Right greater than left.  New symptom.  With heart murmer.  Get chest x-ray.  ? Doing vascular studies.

## 2017-09-27 NOTE — Addendum Note (Signed)
Addended by: Haiden Rawlinson R on: 09/27/2017 02:34 PM   Modules accepted: Orders  

## 2017-09-28 ENCOUNTER — Telehealth: Payer: Self-pay | Admitting: Unknown Physician Specialty

## 2017-09-28 DIAGNOSIS — Z681 Body mass index (BMI) 19 or less, adult: Secondary | ICD-10-CM | POA: Diagnosis not present

## 2017-09-28 DIAGNOSIS — Z87891 Personal history of nicotine dependence: Secondary | ICD-10-CM | POA: Diagnosis not present

## 2017-09-28 DIAGNOSIS — R634 Abnormal weight loss: Secondary | ICD-10-CM | POA: Diagnosis not present

## 2017-09-28 DIAGNOSIS — R011 Cardiac murmur, unspecified: Secondary | ICD-10-CM | POA: Diagnosis not present

## 2017-09-28 DIAGNOSIS — I10 Essential (primary) hypertension: Secondary | ICD-10-CM | POA: Diagnosis not present

## 2017-09-28 DIAGNOSIS — J449 Chronic obstructive pulmonary disease, unspecified: Secondary | ICD-10-CM | POA: Diagnosis not present

## 2017-09-28 DIAGNOSIS — M1990 Unspecified osteoarthritis, unspecified site: Secondary | ICD-10-CM | POA: Diagnosis not present

## 2017-09-28 DIAGNOSIS — K279 Peptic ulcer, site unspecified, unspecified as acute or chronic, without hemorrhage or perforation: Secondary | ICD-10-CM | POA: Diagnosis not present

## 2017-09-28 DIAGNOSIS — F0150 Vascular dementia without behavioral disturbance: Secondary | ICD-10-CM | POA: Diagnosis not present

## 2017-09-28 DIAGNOSIS — R627 Adult failure to thrive: Secondary | ICD-10-CM | POA: Diagnosis not present

## 2017-09-28 DIAGNOSIS — G894 Chronic pain syndrome: Secondary | ICD-10-CM | POA: Diagnosis not present

## 2017-09-28 DIAGNOSIS — M419 Scoliosis, unspecified: Secondary | ICD-10-CM | POA: Diagnosis not present

## 2017-09-28 DIAGNOSIS — R600 Localized edema: Secondary | ICD-10-CM | POA: Diagnosis not present

## 2017-09-28 DIAGNOSIS — R630 Anorexia: Secondary | ICD-10-CM | POA: Diagnosis not present

## 2017-09-28 DIAGNOSIS — K219 Gastro-esophageal reflux disease without esophagitis: Secondary | ICD-10-CM | POA: Diagnosis not present

## 2017-09-28 DIAGNOSIS — N3000 Acute cystitis without hematuria: Secondary | ICD-10-CM | POA: Diagnosis not present

## 2017-09-28 DIAGNOSIS — E039 Hypothyroidism, unspecified: Secondary | ICD-10-CM | POA: Diagnosis not present

## 2017-09-28 DIAGNOSIS — I69318 Other symptoms and signs involving cognitive functions following cerebral infarction: Secondary | ICD-10-CM | POA: Diagnosis not present

## 2017-09-28 DIAGNOSIS — R636 Underweight: Secondary | ICD-10-CM | POA: Diagnosis not present

## 2017-09-28 NOTE — Telephone Encounter (Signed)
Called and let patient's son know what Elnita MaxwellCheryl said. He states that he will figure something out. Asked for him to call us back if there was anything else we could do for them.

## 2017-09-28 NOTE — Telephone Encounter (Signed)
According to the latest urine, the UTI is clearing.  If she is that dramatically worse, she should go to the ER for further evaluation.

## 2017-09-28 NOTE — Telephone Encounter (Signed)
Patient's son stopped by office to inform provider that patient's condition has gotten worse and that her UTI has not gotten better. Patient is unable to walk and still having issues with possible UTI.   Minus Libertyobert Montel: [phone]  Please Advise.  Thank you

## 2017-09-28 NOTE — Telephone Encounter (Signed)
Routing to provider  

## 2017-09-29 ENCOUNTER — Telehealth: Payer: Self-pay | Admitting: Unknown Physician Specialty

## 2017-09-29 DIAGNOSIS — R630 Anorexia: Secondary | ICD-10-CM | POA: Diagnosis not present

## 2017-09-29 DIAGNOSIS — R634 Abnormal weight loss: Secondary | ICD-10-CM | POA: Diagnosis not present

## 2017-09-29 DIAGNOSIS — R627 Adult failure to thrive: Secondary | ICD-10-CM | POA: Diagnosis not present

## 2017-09-29 DIAGNOSIS — I10 Essential (primary) hypertension: Secondary | ICD-10-CM | POA: Diagnosis not present

## 2017-09-29 DIAGNOSIS — I69318 Other symptoms and signs involving cognitive functions following cerebral infarction: Secondary | ICD-10-CM | POA: Diagnosis not present

## 2017-09-29 DIAGNOSIS — R636 Underweight: Secondary | ICD-10-CM | POA: Diagnosis not present

## 2017-09-29 NOTE — Telephone Encounter (Signed)
Yes.  Order given

## 2017-09-29 NOTE — Telephone Encounter (Signed)
Routing to provider  

## 2017-09-29 NOTE — Telephone Encounter (Signed)
Called and gave Glenice BowDavena the verbal ok for urine culture.

## 2017-09-29 NOTE — Telephone Encounter (Signed)
Copied from CRM [phone] [id number]. Topic: Quick Communication - See Telephone Encounter >> Sep 29, 2017  3:35 PM Terisa Starraylor, Brittany L wrote: CRM for notification. See Telephone encounter for: 09/29/17.  Davnea [facility] called and wanted to let Elnita MaxwellCheryl know that she keeps having symptoms of an UTI. She wants to know if she can have an order for her to do the culture. Call back is [phone]

## 2017-09-30 ENCOUNTER — Telehealth: Payer: Self-pay | Admitting: Unknown Physician Specialty

## 2017-09-30 DIAGNOSIS — R630 Anorexia: Secondary | ICD-10-CM | POA: Diagnosis not present

## 2017-09-30 DIAGNOSIS — I10 Essential (primary) hypertension: Secondary | ICD-10-CM | POA: Diagnosis not present

## 2017-09-30 DIAGNOSIS — R627 Adult failure to thrive: Secondary | ICD-10-CM | POA: Diagnosis not present

## 2017-09-30 DIAGNOSIS — I69318 Other symptoms and signs involving cognitive functions following cerebral infarction: Secondary | ICD-10-CM | POA: Diagnosis not present

## 2017-09-30 DIAGNOSIS — R634 Abnormal weight loss: Secondary | ICD-10-CM | POA: Diagnosis not present

## 2017-09-30 DIAGNOSIS — R636 Underweight: Secondary | ICD-10-CM | POA: Diagnosis not present

## 2017-09-30 MED ORDER — SULFAMETHOXAZOLE-TRIMETHOPRIM 800-160 MG PO TABS: 1.0000 | ORAL_TABLET | Freq: Two times a day (BID) | ORAL | 0 refills | Status: DC

## 2017-09-30 NOTE — Telephone Encounter (Signed)
Copied from CRM [id number]. Topic: General - Other >> Sep 30, 2017 12:27 PM Percival SpanishKennedy, [name] wrote:  Allison Bowman with [facility]  call to ask if a antibiotic will be written for pt .   [name] would like a call back  about some swelling pt is having  [phone]    Called hospice nurse and no answer.  Will call in Bactrim  Gabriel Cirriheryl Zyire Eidson

## 2017-10-04 ENCOUNTER — Telehealth: Payer: Self-pay | Admitting: Family Medicine

## 2017-10-04 DIAGNOSIS — R634 Abnormal weight loss: Secondary | ICD-10-CM | POA: Diagnosis not present

## 2017-10-04 DIAGNOSIS — R630 Anorexia: Secondary | ICD-10-CM | POA: Diagnosis not present

## 2017-10-04 DIAGNOSIS — I10 Essential (primary) hypertension: Secondary | ICD-10-CM | POA: Diagnosis not present

## 2017-10-04 DIAGNOSIS — R627 Adult failure to thrive: Secondary | ICD-10-CM | POA: Diagnosis not present

## 2017-10-04 DIAGNOSIS — I69318 Other symptoms and signs involving cognitive functions following cerebral infarction: Secondary | ICD-10-CM | POA: Diagnosis not present

## 2017-10-04 DIAGNOSIS — R636 Underweight: Secondary | ICD-10-CM | POA: Diagnosis not present

## 2017-10-04 NOTE — Telephone Encounter (Signed)
Fax with urine results came in. See paper document.

## 2017-10-05 DIAGNOSIS — I69318 Other symptoms and signs involving cognitive functions following cerebral infarction: Secondary | ICD-10-CM | POA: Diagnosis not present

## 2017-10-05 DIAGNOSIS — R636 Underweight: Secondary | ICD-10-CM | POA: Diagnosis not present

## 2017-10-05 DIAGNOSIS — I10 Essential (primary) hypertension: Secondary | ICD-10-CM | POA: Diagnosis not present

## 2017-10-05 DIAGNOSIS — R634 Abnormal weight loss: Secondary | ICD-10-CM | POA: Diagnosis not present

## 2017-10-05 DIAGNOSIS — R627 Adult failure to thrive: Secondary | ICD-10-CM | POA: Diagnosis not present

## 2017-10-05 DIAGNOSIS — R630 Anorexia: Secondary | ICD-10-CM | POA: Diagnosis not present

## 2017-10-06 ENCOUNTER — Telehealth: Payer: Self-pay | Admitting: Unknown Physician Specialty

## 2017-10-06 DIAGNOSIS — R636 Underweight: Secondary | ICD-10-CM | POA: Diagnosis not present

## 2017-10-06 DIAGNOSIS — R634 Abnormal weight loss: Secondary | ICD-10-CM | POA: Diagnosis not present

## 2017-10-06 DIAGNOSIS — R630 Anorexia: Secondary | ICD-10-CM | POA: Diagnosis not present

## 2017-10-06 DIAGNOSIS — I69318 Other symptoms and signs involving cognitive functions following cerebral infarction: Secondary | ICD-10-CM | POA: Diagnosis not present

## 2017-10-06 DIAGNOSIS — I10 Essential (primary) hypertension: Secondary | ICD-10-CM | POA: Diagnosis not present

## 2017-10-06 DIAGNOSIS — R627 Adult failure to thrive: Secondary | ICD-10-CM | POA: Diagnosis not present

## 2017-10-06 NOTE — Telephone Encounter (Signed)
Copied from CRM [id number]. Topic: Inquiry >> Oct 06, 2017  1:08 PM Crist InfanteHarrald, Kathy J wrote: Reason for CRM: Devana with [facility] called to advice: Pt has Edema in legs, ankles and feet.  Son thinks pt should be on a diuretic.  Son borrowing some from a neighbor ( Hydrochlorothiazide ) Does cheryl think pt should be on a diuretic?  Devana thinks this has been helping.   In the past pt was on 2 inhalers. They could not afford in the past. So the son has been going to the neighbors again to get inhalers.  So if ok for pt to be prescribed inhalers, hospice will pay for it. (they will not pay for Advair but albuterol ok)  [facility] [address] - [place], [place] - [address] [phone] (Phone) [phone] (Fax)

## 2017-10-07 MED ORDER — ALBUTEROL SULFATE HFA 108 (90 BASE) MCG/ACT IN AERS: 2.0000 | INHALATION_SPRAY | Freq: Four times a day (QID) | RESPIRATORY_TRACT | 0 refills | Status: DC | PRN

## 2017-10-07 MED ORDER — FUROSEMIDE 20 MG PO TABS: 20.0000 mg | ORAL_TABLET | Freq: Every day | ORAL | 0 refills | Status: AC | PRN

## 2017-10-07 NOTE — Telephone Encounter (Signed)
rxs sent

## 2017-10-08 DIAGNOSIS — I10 Essential (primary) hypertension: Secondary | ICD-10-CM | POA: Diagnosis not present

## 2017-10-08 DIAGNOSIS — I69318 Other symptoms and signs involving cognitive functions following cerebral infarction: Secondary | ICD-10-CM | POA: Diagnosis not present

## 2017-10-08 DIAGNOSIS — R627 Adult failure to thrive: Secondary | ICD-10-CM | POA: Diagnosis not present

## 2017-10-08 DIAGNOSIS — R636 Underweight: Secondary | ICD-10-CM | POA: Diagnosis not present

## 2017-10-08 DIAGNOSIS — R634 Abnormal weight loss: Secondary | ICD-10-CM | POA: Diagnosis not present

## 2017-10-08 DIAGNOSIS — R630 Anorexia: Secondary | ICD-10-CM | POA: Diagnosis not present

## 2017-10-11 ENCOUNTER — Other Ambulatory Visit: Payer: Self-pay

## 2017-10-11 ENCOUNTER — Telehealth: Payer: Self-pay | Admitting: Unknown Physician Specialty

## 2017-10-11 DIAGNOSIS — R627 Adult failure to thrive: Secondary | ICD-10-CM | POA: Diagnosis not present

## 2017-10-11 DIAGNOSIS — R630 Anorexia: Secondary | ICD-10-CM | POA: Diagnosis not present

## 2017-10-11 DIAGNOSIS — R634 Abnormal weight loss: Secondary | ICD-10-CM | POA: Diagnosis not present

## 2017-10-11 DIAGNOSIS — I10 Essential (primary) hypertension: Secondary | ICD-10-CM | POA: Diagnosis not present

## 2017-10-11 DIAGNOSIS — R636 Underweight: Secondary | ICD-10-CM | POA: Diagnosis not present

## 2017-10-11 DIAGNOSIS — I69318 Other symptoms and signs involving cognitive functions following cerebral infarction: Secondary | ICD-10-CM | POA: Diagnosis not present

## 2017-10-11 MED ORDER — OXYCODONE HCL 5 MG PO TABS: 5.0000 mg | ORAL_TABLET | Freq: Four times a day (QID) | ORAL | 0 refills | Status: AC | PRN

## 2017-10-11 MED ORDER — ALBUTEROL SULFATE HFA 108 (90 BASE) MCG/ACT IN AERS: 2.0000 | INHALATION_SPRAY | Freq: Four times a day (QID) | RESPIRATORY_TRACT | 0 refills | Status: AC | PRN

## 2017-10-11 NOTE — Telephone Encounter (Signed)
Albuterol sent to the pharmacy. Called and let patient's son know that the oxycodone RX had to be picked up and taken to the pharmacy. Patient's son stated that he will be by in the morning to pick it up.

## 2017-10-11 NOTE — Telephone Encounter (Signed)
Copied from CRM [id number]. Topic: General - Other >> Oct 11, 2017  9:58 AM Gerrianne Scale wrote: Reason for CRM: Nurse Sue Lush from [facility] [phone] calling for RX on albuterol (PROVENTIL HFA;VENTOLIN HFA) 108 (90 Base) MCG/ACT inhaler and Oxcodone  she think it was written by the Doctor for hospice please send to the pt pharmacy

## 2017-10-11 NOTE — Telephone Encounter (Signed)
Routing to provider  

## 2017-10-12 DIAGNOSIS — R630 Anorexia: Secondary | ICD-10-CM | POA: Diagnosis not present

## 2017-10-12 DIAGNOSIS — I69318 Other symptoms and signs involving cognitive functions following cerebral infarction: Secondary | ICD-10-CM | POA: Diagnosis not present

## 2017-10-12 DIAGNOSIS — R634 Abnormal weight loss: Secondary | ICD-10-CM | POA: Diagnosis not present

## 2017-10-12 DIAGNOSIS — R627 Adult failure to thrive: Secondary | ICD-10-CM | POA: Diagnosis not present

## 2017-10-12 DIAGNOSIS — I10 Essential (primary) hypertension: Secondary | ICD-10-CM | POA: Diagnosis not present

## 2017-10-12 DIAGNOSIS — R636 Underweight: Secondary | ICD-10-CM | POA: Diagnosis not present

## 2017-10-12 NOTE — Telephone Encounter (Signed)
Please ask them to destroy it

## 2017-10-12 NOTE — Telephone Encounter (Signed)
Called [facility] in [place] and let them know what Allison Bowman said. They are destroying the prescription from [place]. #90 was filled at [facility] on [address].

## 2017-10-12 NOTE — Telephone Encounter (Signed)
Routing to [place]. Should the pharmacy just keep your prescription on hold for the patient?

## 2017-10-12 NOTE — Telephone Encounter (Signed)
Allison Bowman w/[facility] on [address] Phone: [phone]   Pt had oxycodone #90 filled yesterday by Dr. Gordy Savers at [facility]. Allison Bowman is going to hold the RX sent yesterday by Gabriel Cirri, NP for #30 oxycodone until she hears back.

## 2017-10-13 DIAGNOSIS — E039 Hypothyroidism, unspecified: Secondary | ICD-10-CM | POA: Diagnosis not present

## 2017-10-13 DIAGNOSIS — R634 Abnormal weight loss: Secondary | ICD-10-CM | POA: Diagnosis not present

## 2017-10-13 DIAGNOSIS — N3000 Acute cystitis without hematuria: Secondary | ICD-10-CM | POA: Diagnosis not present

## 2017-10-13 DIAGNOSIS — R600 Localized edema: Secondary | ICD-10-CM | POA: Diagnosis not present

## 2017-10-13 DIAGNOSIS — K219 Gastro-esophageal reflux disease without esophagitis: Secondary | ICD-10-CM | POA: Diagnosis not present

## 2017-10-13 DIAGNOSIS — M419 Scoliosis, unspecified: Secondary | ICD-10-CM | POA: Diagnosis not present

## 2017-10-13 DIAGNOSIS — G894 Chronic pain syndrome: Secondary | ICD-10-CM | POA: Diagnosis not present

## 2017-10-13 DIAGNOSIS — I69318 Other symptoms and signs involving cognitive functions following cerebral infarction: Secondary | ICD-10-CM | POA: Diagnosis not present

## 2017-10-13 DIAGNOSIS — R636 Underweight: Secondary | ICD-10-CM | POA: Diagnosis not present

## 2017-10-13 DIAGNOSIS — R627 Adult failure to thrive: Secondary | ICD-10-CM | POA: Diagnosis not present

## 2017-10-13 DIAGNOSIS — R630 Anorexia: Secondary | ICD-10-CM | POA: Diagnosis not present

## 2017-10-13 DIAGNOSIS — K279 Peptic ulcer, site unspecified, unspecified as acute or chronic, without hemorrhage or perforation: Secondary | ICD-10-CM | POA: Diagnosis not present

## 2017-10-13 DIAGNOSIS — Z681 Body mass index (BMI) 19 or less, adult: Secondary | ICD-10-CM | POA: Diagnosis not present

## 2017-10-13 DIAGNOSIS — Z87891 Personal history of nicotine dependence: Secondary | ICD-10-CM | POA: Diagnosis not present

## 2017-10-13 DIAGNOSIS — M1990 Unspecified osteoarthritis, unspecified site: Secondary | ICD-10-CM | POA: Diagnosis not present

## 2017-10-13 DIAGNOSIS — I10 Essential (primary) hypertension: Secondary | ICD-10-CM | POA: Diagnosis not present

## 2017-10-13 DIAGNOSIS — J449 Chronic obstructive pulmonary disease, unspecified: Secondary | ICD-10-CM | POA: Diagnosis not present

## 2017-10-13 DIAGNOSIS — F0150 Vascular dementia without behavioral disturbance: Secondary | ICD-10-CM | POA: Diagnosis not present

## 2017-10-13 DIAGNOSIS — R011 Cardiac murmur, unspecified: Secondary | ICD-10-CM | POA: Diagnosis not present

## 2017-10-15 DIAGNOSIS — R630 Anorexia: Secondary | ICD-10-CM | POA: Diagnosis not present

## 2017-10-15 DIAGNOSIS — R627 Adult failure to thrive: Secondary | ICD-10-CM | POA: Diagnosis not present

## 2017-10-15 DIAGNOSIS — I10 Essential (primary) hypertension: Secondary | ICD-10-CM | POA: Diagnosis not present

## 2017-10-15 DIAGNOSIS — R634 Abnormal weight loss: Secondary | ICD-10-CM | POA: Diagnosis not present

## 2017-10-15 DIAGNOSIS — I69318 Other symptoms and signs involving cognitive functions following cerebral infarction: Secondary | ICD-10-CM | POA: Diagnosis not present

## 2017-10-15 DIAGNOSIS — R636 Underweight: Secondary | ICD-10-CM | POA: Diagnosis not present

## 2017-10-19 ENCOUNTER — Telehealth: Payer: Self-pay | Admitting: Unknown Physician Specialty

## 2017-10-19 DIAGNOSIS — I10 Essential (primary) hypertension: Secondary | ICD-10-CM | POA: Diagnosis not present

## 2017-10-19 DIAGNOSIS — I69318 Other symptoms and signs involving cognitive functions following cerebral infarction: Secondary | ICD-10-CM | POA: Diagnosis not present

## 2017-10-19 DIAGNOSIS — R634 Abnormal weight loss: Secondary | ICD-10-CM | POA: Diagnosis not present

## 2017-10-19 DIAGNOSIS — R636 Underweight: Secondary | ICD-10-CM | POA: Diagnosis not present

## 2017-10-19 DIAGNOSIS — R627 Adult failure to thrive: Secondary | ICD-10-CM | POA: Diagnosis not present

## 2017-10-19 DIAGNOSIS — R630 Anorexia: Secondary | ICD-10-CM | POA: Diagnosis not present

## 2017-10-19 NOTE — Telephone Encounter (Signed)
Called and left a VM letting them know that it was OK to do a UA on the patient per [place].

## 2017-10-19 NOTE — Telephone Encounter (Signed)
Routing to provider for approval

## 2017-10-19 NOTE — Telephone Encounter (Signed)
Copied from CRM [id number]. Topic: Quick Communication - See Telephone Encounter >> Oct 19, 2017  3:48 PM Terisa Starr wrote: CRM for notification. See Telephone encounter for: 10/19/17.  Savannah, RN from [facility] of [facility] called and said that she was treated two weeks ago for a UTI. She said that she thinks she still has it because her urine smells and does not look good. She wants to know can she get approval from [place] to do a UA on her.  Call back is [phone]

## 2017-10-19 NOTE — Telephone Encounter (Signed)
Yes she can. Thanks! 

## 2017-10-20 DIAGNOSIS — R634 Abnormal weight loss: Secondary | ICD-10-CM | POA: Diagnosis not present

## 2017-10-20 DIAGNOSIS — I69318 Other symptoms and signs involving cognitive functions following cerebral infarction: Secondary | ICD-10-CM | POA: Diagnosis not present

## 2017-10-20 DIAGNOSIS — I10 Essential (primary) hypertension: Secondary | ICD-10-CM | POA: Diagnosis not present

## 2017-10-20 DIAGNOSIS — R636 Underweight: Secondary | ICD-10-CM | POA: Diagnosis not present

## 2017-10-20 DIAGNOSIS — R627 Adult failure to thrive: Secondary | ICD-10-CM | POA: Diagnosis not present

## 2017-10-20 DIAGNOSIS — R630 Anorexia: Secondary | ICD-10-CM | POA: Diagnosis not present

## 2017-10-22 DIAGNOSIS — I69318 Other symptoms and signs involving cognitive functions following cerebral infarction: Secondary | ICD-10-CM | POA: Diagnosis not present

## 2017-10-22 DIAGNOSIS — R636 Underweight: Secondary | ICD-10-CM | POA: Diagnosis not present

## 2017-10-22 DIAGNOSIS — I10 Essential (primary) hypertension: Secondary | ICD-10-CM | POA: Diagnosis not present

## 2017-10-22 DIAGNOSIS — R630 Anorexia: Secondary | ICD-10-CM | POA: Diagnosis not present

## 2017-10-22 DIAGNOSIS — R627 Adult failure to thrive: Secondary | ICD-10-CM | POA: Diagnosis not present

## 2017-10-22 DIAGNOSIS — R634 Abnormal weight loss: Secondary | ICD-10-CM | POA: Diagnosis not present

## 2017-10-25 DIAGNOSIS — R634 Abnormal weight loss: Secondary | ICD-10-CM | POA: Diagnosis not present

## 2017-10-25 DIAGNOSIS — R636 Underweight: Secondary | ICD-10-CM | POA: Diagnosis not present

## 2017-10-25 DIAGNOSIS — I69318 Other symptoms and signs involving cognitive functions following cerebral infarction: Secondary | ICD-10-CM | POA: Diagnosis not present

## 2017-10-25 DIAGNOSIS — I10 Essential (primary) hypertension: Secondary | ICD-10-CM | POA: Diagnosis not present

## 2017-10-25 DIAGNOSIS — R627 Adult failure to thrive: Secondary | ICD-10-CM | POA: Diagnosis not present

## 2017-10-25 DIAGNOSIS — R630 Anorexia: Secondary | ICD-10-CM | POA: Diagnosis not present

## 2017-10-26 ENCOUNTER — Ambulatory Visit: Payer: Medicare Other | Admitting: Unknown Physician Specialty

## 2017-10-27 DIAGNOSIS — R634 Abnormal weight loss: Secondary | ICD-10-CM | POA: Diagnosis not present

## 2017-10-27 DIAGNOSIS — I10 Essential (primary) hypertension: Secondary | ICD-10-CM | POA: Diagnosis not present

## 2017-10-27 DIAGNOSIS — R627 Adult failure to thrive: Secondary | ICD-10-CM | POA: Diagnosis not present

## 2017-10-27 DIAGNOSIS — I69318 Other symptoms and signs involving cognitive functions following cerebral infarction: Secondary | ICD-10-CM | POA: Diagnosis not present

## 2017-10-27 DIAGNOSIS — R636 Underweight: Secondary | ICD-10-CM | POA: Diagnosis not present

## 2017-10-27 DIAGNOSIS — R630 Anorexia: Secondary | ICD-10-CM | POA: Diagnosis not present

## 2017-10-29 DIAGNOSIS — R636 Underweight: Secondary | ICD-10-CM | POA: Diagnosis not present

## 2017-10-29 DIAGNOSIS — I10 Essential (primary) hypertension: Secondary | ICD-10-CM | POA: Diagnosis not present

## 2017-10-29 DIAGNOSIS — I69318 Other symptoms and signs involving cognitive functions following cerebral infarction: Secondary | ICD-10-CM | POA: Diagnosis not present

## 2017-10-29 DIAGNOSIS — R630 Anorexia: Secondary | ICD-10-CM | POA: Diagnosis not present

## 2017-10-29 DIAGNOSIS — R627 Adult failure to thrive: Secondary | ICD-10-CM | POA: Diagnosis not present

## 2017-10-29 DIAGNOSIS — R634 Abnormal weight loss: Secondary | ICD-10-CM | POA: Diagnosis not present

## 2017-11-01 DIAGNOSIS — R636 Underweight: Secondary | ICD-10-CM | POA: Diagnosis not present

## 2017-11-01 DIAGNOSIS — I69318 Other symptoms and signs involving cognitive functions following cerebral infarction: Secondary | ICD-10-CM | POA: Diagnosis not present

## 2017-11-01 DIAGNOSIS — R630 Anorexia: Secondary | ICD-10-CM | POA: Diagnosis not present

## 2017-11-01 DIAGNOSIS — R627 Adult failure to thrive: Secondary | ICD-10-CM | POA: Diagnosis not present

## 2017-11-01 DIAGNOSIS — I10 Essential (primary) hypertension: Secondary | ICD-10-CM | POA: Diagnosis not present

## 2017-11-01 DIAGNOSIS — R634 Abnormal weight loss: Secondary | ICD-10-CM | POA: Diagnosis not present

## 2017-11-03 DIAGNOSIS — I69318 Other symptoms and signs involving cognitive functions following cerebral infarction: Secondary | ICD-10-CM | POA: Diagnosis not present

## 2017-11-03 DIAGNOSIS — R630 Anorexia: Secondary | ICD-10-CM | POA: Diagnosis not present

## 2017-11-03 DIAGNOSIS — R636 Underweight: Secondary | ICD-10-CM | POA: Diagnosis not present

## 2017-11-03 DIAGNOSIS — R627 Adult failure to thrive: Secondary | ICD-10-CM | POA: Diagnosis not present

## 2017-11-03 DIAGNOSIS — I10 Essential (primary) hypertension: Secondary | ICD-10-CM | POA: Diagnosis not present

## 2017-11-03 DIAGNOSIS — R634 Abnormal weight loss: Secondary | ICD-10-CM | POA: Diagnosis not present

## 2017-11-04 DIAGNOSIS — R636 Underweight: Secondary | ICD-10-CM | POA: Diagnosis not present

## 2017-11-04 DIAGNOSIS — R634 Abnormal weight loss: Secondary | ICD-10-CM | POA: Diagnosis not present

## 2017-11-04 DIAGNOSIS — I69318 Other symptoms and signs involving cognitive functions following cerebral infarction: Secondary | ICD-10-CM | POA: Diagnosis not present

## 2017-11-04 DIAGNOSIS — I10 Essential (primary) hypertension: Secondary | ICD-10-CM | POA: Diagnosis not present

## 2017-11-04 DIAGNOSIS — R630 Anorexia: Secondary | ICD-10-CM | POA: Diagnosis not present

## 2017-11-04 DIAGNOSIS — R627 Adult failure to thrive: Secondary | ICD-10-CM | POA: Diagnosis not present

## 2017-11-05 DIAGNOSIS — I69318 Other symptoms and signs involving cognitive functions following cerebral infarction: Secondary | ICD-10-CM | POA: Diagnosis not present

## 2017-11-05 DIAGNOSIS — R627 Adult failure to thrive: Secondary | ICD-10-CM | POA: Diagnosis not present

## 2017-11-05 DIAGNOSIS — R630 Anorexia: Secondary | ICD-10-CM | POA: Diagnosis not present

## 2017-11-05 DIAGNOSIS — R636 Underweight: Secondary | ICD-10-CM | POA: Diagnosis not present

## 2017-11-05 DIAGNOSIS — R634 Abnormal weight loss: Secondary | ICD-10-CM | POA: Diagnosis not present

## 2017-11-05 DIAGNOSIS — I10 Essential (primary) hypertension: Secondary | ICD-10-CM | POA: Diagnosis not present

## 2017-11-06 DIAGNOSIS — R636 Underweight: Secondary | ICD-10-CM | POA: Diagnosis not present

## 2017-11-06 DIAGNOSIS — R630 Anorexia: Secondary | ICD-10-CM | POA: Diagnosis not present

## 2017-11-06 DIAGNOSIS — R627 Adult failure to thrive: Secondary | ICD-10-CM | POA: Diagnosis not present

## 2017-11-06 DIAGNOSIS — I69318 Other symptoms and signs involving cognitive functions following cerebral infarction: Secondary | ICD-10-CM | POA: Diagnosis not present

## 2017-11-06 DIAGNOSIS — R634 Abnormal weight loss: Secondary | ICD-10-CM | POA: Diagnosis not present

## 2017-11-06 DIAGNOSIS — I10 Essential (primary) hypertension: Secondary | ICD-10-CM | POA: Diagnosis not present

## 2017-11-07 DIAGNOSIS — R636 Underweight: Secondary | ICD-10-CM | POA: Diagnosis not present

## 2017-11-07 DIAGNOSIS — I69318 Other symptoms and signs involving cognitive functions following cerebral infarction: Secondary | ICD-10-CM | POA: Diagnosis not present

## 2017-11-07 DIAGNOSIS — R627 Adult failure to thrive: Secondary | ICD-10-CM | POA: Diagnosis not present

## 2017-11-07 DIAGNOSIS — R634 Abnormal weight loss: Secondary | ICD-10-CM | POA: Diagnosis not present

## 2017-11-07 DIAGNOSIS — I10 Essential (primary) hypertension: Secondary | ICD-10-CM | POA: Diagnosis not present

## 2017-11-07 DIAGNOSIS — R630 Anorexia: Secondary | ICD-10-CM | POA: Diagnosis not present

## 2017-11-10 DIAGNOSIS — I10 Essential (primary) hypertension: Secondary | ICD-10-CM | POA: Diagnosis not present

## 2017-11-10 DIAGNOSIS — R634 Abnormal weight loss: Secondary | ICD-10-CM | POA: Diagnosis not present

## 2017-11-10 DIAGNOSIS — R636 Underweight: Secondary | ICD-10-CM | POA: Diagnosis not present

## 2017-11-10 DIAGNOSIS — R630 Anorexia: Secondary | ICD-10-CM | POA: Diagnosis not present

## 2017-11-10 DIAGNOSIS — R627 Adult failure to thrive: Secondary | ICD-10-CM | POA: Diagnosis not present

## 2017-11-10 DIAGNOSIS — I69318 Other symptoms and signs involving cognitive functions following cerebral infarction: Secondary | ICD-10-CM | POA: Diagnosis not present

## 2017-11-12 DIAGNOSIS — R627 Adult failure to thrive: Secondary | ICD-10-CM | POA: Diagnosis not present

## 2017-11-12 DIAGNOSIS — I69318 Other symptoms and signs involving cognitive functions following cerebral infarction: Secondary | ICD-10-CM | POA: Diagnosis not present

## 2017-11-12 DIAGNOSIS — R636 Underweight: Secondary | ICD-10-CM | POA: Diagnosis not present

## 2017-11-12 DIAGNOSIS — R630 Anorexia: Secondary | ICD-10-CM | POA: Diagnosis not present

## 2017-11-12 DIAGNOSIS — R634 Abnormal weight loss: Secondary | ICD-10-CM | POA: Diagnosis not present

## 2017-11-12 DIAGNOSIS — I10 Essential (primary) hypertension: Secondary | ICD-10-CM | POA: Diagnosis not present

## 2017-11-13 DIAGNOSIS — I69318 Other symptoms and signs involving cognitive functions following cerebral infarction: Secondary | ICD-10-CM | POA: Diagnosis not present

## 2017-11-13 DIAGNOSIS — Z87891 Personal history of nicotine dependence: Secondary | ICD-10-CM | POA: Diagnosis not present

## 2017-11-13 DIAGNOSIS — R630 Anorexia: Secondary | ICD-10-CM | POA: Diagnosis not present

## 2017-11-13 DIAGNOSIS — M419 Scoliosis, unspecified: Secondary | ICD-10-CM | POA: Diagnosis not present

## 2017-11-13 DIAGNOSIS — M1990 Unspecified osteoarthritis, unspecified site: Secondary | ICD-10-CM | POA: Diagnosis not present

## 2017-11-13 DIAGNOSIS — Z681 Body mass index (BMI) 19 or less, adult: Secondary | ICD-10-CM | POA: Diagnosis not present

## 2017-11-13 DIAGNOSIS — N3000 Acute cystitis without hematuria: Secondary | ICD-10-CM | POA: Diagnosis not present

## 2017-11-13 DIAGNOSIS — E039 Hypothyroidism, unspecified: Secondary | ICD-10-CM | POA: Diagnosis not present

## 2017-11-13 DIAGNOSIS — R600 Localized edema: Secondary | ICD-10-CM | POA: Diagnosis not present

## 2017-11-13 DIAGNOSIS — K219 Gastro-esophageal reflux disease without esophagitis: Secondary | ICD-10-CM | POA: Diagnosis not present

## 2017-11-13 DIAGNOSIS — I10 Essential (primary) hypertension: Secondary | ICD-10-CM | POA: Diagnosis not present

## 2017-11-13 DIAGNOSIS — F0150 Vascular dementia without behavioral disturbance: Secondary | ICD-10-CM | POA: Diagnosis not present

## 2017-11-13 DIAGNOSIS — R636 Underweight: Secondary | ICD-10-CM | POA: Diagnosis not present

## 2017-11-13 DIAGNOSIS — R011 Cardiac murmur, unspecified: Secondary | ICD-10-CM | POA: Diagnosis not present

## 2017-11-13 DIAGNOSIS — G894 Chronic pain syndrome: Secondary | ICD-10-CM | POA: Diagnosis not present

## 2017-11-13 DIAGNOSIS — K279 Peptic ulcer, site unspecified, unspecified as acute or chronic, without hemorrhage or perforation: Secondary | ICD-10-CM | POA: Diagnosis not present

## 2017-11-13 DIAGNOSIS — R634 Abnormal weight loss: Secondary | ICD-10-CM | POA: Diagnosis not present

## 2017-11-13 DIAGNOSIS — R627 Adult failure to thrive: Secondary | ICD-10-CM | POA: Diagnosis not present

## 2017-11-13 DIAGNOSIS — J449 Chronic obstructive pulmonary disease, unspecified: Secondary | ICD-10-CM | POA: Diagnosis not present

## 2017-11-15 DIAGNOSIS — I69318 Other symptoms and signs involving cognitive functions following cerebral infarction: Secondary | ICD-10-CM | POA: Diagnosis not present

## 2017-11-15 DIAGNOSIS — R636 Underweight: Secondary | ICD-10-CM | POA: Diagnosis not present

## 2017-11-15 DIAGNOSIS — R634 Abnormal weight loss: Secondary | ICD-10-CM | POA: Diagnosis not present

## 2017-11-15 DIAGNOSIS — R627 Adult failure to thrive: Secondary | ICD-10-CM | POA: Diagnosis not present

## 2017-11-15 DIAGNOSIS — R630 Anorexia: Secondary | ICD-10-CM | POA: Diagnosis not present

## 2017-11-15 DIAGNOSIS — I10 Essential (primary) hypertension: Secondary | ICD-10-CM | POA: Diagnosis not present

## 2017-11-17 DIAGNOSIS — R627 Adult failure to thrive: Secondary | ICD-10-CM | POA: Diagnosis not present

## 2017-11-17 DIAGNOSIS — I69318 Other symptoms and signs involving cognitive functions following cerebral infarction: Secondary | ICD-10-CM | POA: Diagnosis not present

## 2017-11-17 DIAGNOSIS — R636 Underweight: Secondary | ICD-10-CM | POA: Diagnosis not present

## 2017-11-17 DIAGNOSIS — R630 Anorexia: Secondary | ICD-10-CM | POA: Diagnosis not present

## 2017-11-17 DIAGNOSIS — R634 Abnormal weight loss: Secondary | ICD-10-CM | POA: Diagnosis not present

## 2017-11-17 DIAGNOSIS — I10 Essential (primary) hypertension: Secondary | ICD-10-CM | POA: Diagnosis not present

## 2017-11-18 DIAGNOSIS — R630 Anorexia: Secondary | ICD-10-CM | POA: Diagnosis not present

## 2017-11-18 DIAGNOSIS — I69318 Other symptoms and signs involving cognitive functions following cerebral infarction: Secondary | ICD-10-CM | POA: Diagnosis not present

## 2017-11-18 DIAGNOSIS — R634 Abnormal weight loss: Secondary | ICD-10-CM | POA: Diagnosis not present

## 2017-11-18 DIAGNOSIS — R636 Underweight: Secondary | ICD-10-CM | POA: Diagnosis not present

## 2017-11-18 DIAGNOSIS — I10 Essential (primary) hypertension: Secondary | ICD-10-CM | POA: Diagnosis not present

## 2017-11-18 DIAGNOSIS — R627 Adult failure to thrive: Secondary | ICD-10-CM | POA: Diagnosis not present

## 2017-11-19 DIAGNOSIS — I10 Essential (primary) hypertension: Secondary | ICD-10-CM | POA: Diagnosis not present

## 2017-11-19 DIAGNOSIS — I69318 Other symptoms and signs involving cognitive functions following cerebral infarction: Secondary | ICD-10-CM | POA: Diagnosis not present

## 2017-11-19 DIAGNOSIS — R630 Anorexia: Secondary | ICD-10-CM | POA: Diagnosis not present

## 2017-11-19 DIAGNOSIS — R634 Abnormal weight loss: Secondary | ICD-10-CM | POA: Diagnosis not present

## 2017-11-19 DIAGNOSIS — R636 Underweight: Secondary | ICD-10-CM | POA: Diagnosis not present

## 2017-11-19 DIAGNOSIS — R627 Adult failure to thrive: Secondary | ICD-10-CM | POA: Diagnosis not present

## 2017-11-21 DIAGNOSIS — I69318 Other symptoms and signs involving cognitive functions following cerebral infarction: Secondary | ICD-10-CM | POA: Diagnosis not present

## 2017-11-21 DIAGNOSIS — R636 Underweight: Secondary | ICD-10-CM | POA: Diagnosis not present

## 2017-11-21 DIAGNOSIS — R634 Abnormal weight loss: Secondary | ICD-10-CM | POA: Diagnosis not present

## 2017-11-21 DIAGNOSIS — R627 Adult failure to thrive: Secondary | ICD-10-CM | POA: Diagnosis not present

## 2017-11-21 DIAGNOSIS — I10 Essential (primary) hypertension: Secondary | ICD-10-CM | POA: Diagnosis not present

## 2017-11-21 DIAGNOSIS — R630 Anorexia: Secondary | ICD-10-CM | POA: Diagnosis not present

## 2017-11-22 DIAGNOSIS — I10 Essential (primary) hypertension: Secondary | ICD-10-CM | POA: Diagnosis not present

## 2017-11-22 DIAGNOSIS — I69318 Other symptoms and signs involving cognitive functions following cerebral infarction: Secondary | ICD-10-CM | POA: Diagnosis not present

## 2017-11-22 DIAGNOSIS — R630 Anorexia: Secondary | ICD-10-CM | POA: Diagnosis not present

## 2017-11-22 DIAGNOSIS — R634 Abnormal weight loss: Secondary | ICD-10-CM | POA: Diagnosis not present

## 2017-11-22 DIAGNOSIS — R636 Underweight: Secondary | ICD-10-CM | POA: Diagnosis not present

## 2017-11-22 DIAGNOSIS — R627 Adult failure to thrive: Secondary | ICD-10-CM | POA: Diagnosis not present

## 2017-11-24 DIAGNOSIS — R634 Abnormal weight loss: Secondary | ICD-10-CM | POA: Diagnosis not present

## 2017-11-24 DIAGNOSIS — I69318 Other symptoms and signs involving cognitive functions following cerebral infarction: Secondary | ICD-10-CM | POA: Diagnosis not present

## 2017-11-24 DIAGNOSIS — R636 Underweight: Secondary | ICD-10-CM | POA: Diagnosis not present

## 2017-11-24 DIAGNOSIS — I10 Essential (primary) hypertension: Secondary | ICD-10-CM | POA: Diagnosis not present

## 2017-11-24 DIAGNOSIS — R630 Anorexia: Secondary | ICD-10-CM | POA: Diagnosis not present

## 2017-11-24 DIAGNOSIS — R627 Adult failure to thrive: Secondary | ICD-10-CM | POA: Diagnosis not present

## 2017-11-26 DIAGNOSIS — R630 Anorexia: Secondary | ICD-10-CM | POA: Diagnosis not present

## 2017-11-26 DIAGNOSIS — R627 Adult failure to thrive: Secondary | ICD-10-CM | POA: Diagnosis not present

## 2017-11-26 DIAGNOSIS — I10 Essential (primary) hypertension: Secondary | ICD-10-CM | POA: Diagnosis not present

## 2017-11-26 DIAGNOSIS — R634 Abnormal weight loss: Secondary | ICD-10-CM | POA: Diagnosis not present

## 2017-11-26 DIAGNOSIS — R636 Underweight: Secondary | ICD-10-CM | POA: Diagnosis not present

## 2017-11-26 DIAGNOSIS — I69318 Other symptoms and signs involving cognitive functions following cerebral infarction: Secondary | ICD-10-CM | POA: Diagnosis not present

## 2017-11-29 DIAGNOSIS — I69318 Other symptoms and signs involving cognitive functions following cerebral infarction: Secondary | ICD-10-CM | POA: Diagnosis not present

## 2017-11-29 DIAGNOSIS — R627 Adult failure to thrive: Secondary | ICD-10-CM | POA: Diagnosis not present

## 2017-11-29 DIAGNOSIS — R636 Underweight: Secondary | ICD-10-CM | POA: Diagnosis not present

## 2017-11-29 DIAGNOSIS — I10 Essential (primary) hypertension: Secondary | ICD-10-CM | POA: Diagnosis not present

## 2017-11-29 DIAGNOSIS — R630 Anorexia: Secondary | ICD-10-CM | POA: Diagnosis not present

## 2017-11-29 DIAGNOSIS — R634 Abnormal weight loss: Secondary | ICD-10-CM | POA: Diagnosis not present

## 2017-12-01 ENCOUNTER — Telehealth: Payer: Self-pay | Admitting: Unknown Physician Specialty

## 2017-12-01 DIAGNOSIS — I10 Essential (primary) hypertension: Secondary | ICD-10-CM

## 2017-12-01 DIAGNOSIS — I69318 Other symptoms and signs involving cognitive functions following cerebral infarction: Secondary | ICD-10-CM | POA: Diagnosis not present

## 2017-12-01 DIAGNOSIS — R630 Anorexia: Secondary | ICD-10-CM | POA: Diagnosis not present

## 2017-12-01 DIAGNOSIS — R634 Abnormal weight loss: Secondary | ICD-10-CM | POA: Diagnosis not present

## 2017-12-01 DIAGNOSIS — R627 Adult failure to thrive: Secondary | ICD-10-CM | POA: Diagnosis not present

## 2017-12-01 DIAGNOSIS — R636 Underweight: Secondary | ICD-10-CM | POA: Diagnosis not present

## 2017-12-01 DIAGNOSIS — R399 Unspecified symptoms and signs involving the genitourinary system: Secondary | ICD-10-CM

## 2017-12-01 NOTE — Telephone Encounter (Signed)
Call pt  Order urinalysis for this patient please

## 2017-12-01 NOTE — Telephone Encounter (Signed)
Copied from CRM [id number]. Topic: General - Other >> Dec 01, 2017 10:54 AM Maia Pettiesrtiz, Kristie S wrote: Reason for CRM: requesting VO for UA. Pt has smelly urine and burning with urination. Pt has history of UTI.  Call with verbal at [phone] (secure voicemail ok to leave detailed message) or fax to hospice fax # [phone]

## 2017-12-01 NOTE — Telephone Encounter (Signed)
Message relayed to patient. Verbalized understanding and denied questions.   

## 2017-12-02 DIAGNOSIS — R627 Adult failure to thrive: Secondary | ICD-10-CM | POA: Diagnosis not present

## 2017-12-02 DIAGNOSIS — R630 Anorexia: Secondary | ICD-10-CM | POA: Diagnosis not present

## 2017-12-02 DIAGNOSIS — I10 Essential (primary) hypertension: Secondary | ICD-10-CM | POA: Diagnosis not present

## 2017-12-02 DIAGNOSIS — R636 Underweight: Secondary | ICD-10-CM | POA: Diagnosis not present

## 2017-12-02 DIAGNOSIS — I69318 Other symptoms and signs involving cognitive functions following cerebral infarction: Secondary | ICD-10-CM | POA: Diagnosis not present

## 2017-12-02 DIAGNOSIS — R634 Abnormal weight loss: Secondary | ICD-10-CM | POA: Diagnosis not present

## 2017-12-03 DIAGNOSIS — R634 Abnormal weight loss: Secondary | ICD-10-CM | POA: Diagnosis not present

## 2017-12-03 DIAGNOSIS — R627 Adult failure to thrive: Secondary | ICD-10-CM | POA: Diagnosis not present

## 2017-12-03 DIAGNOSIS — I69318 Other symptoms and signs involving cognitive functions following cerebral infarction: Secondary | ICD-10-CM | POA: Diagnosis not present

## 2017-12-03 DIAGNOSIS — R630 Anorexia: Secondary | ICD-10-CM | POA: Diagnosis not present

## 2017-12-03 DIAGNOSIS — I10 Essential (primary) hypertension: Secondary | ICD-10-CM | POA: Diagnosis not present

## 2017-12-03 DIAGNOSIS — R636 Underweight: Secondary | ICD-10-CM | POA: Diagnosis not present

## 2017-12-06 DIAGNOSIS — R627 Adult failure to thrive: Secondary | ICD-10-CM | POA: Diagnosis not present

## 2017-12-06 DIAGNOSIS — I69318 Other symptoms and signs involving cognitive functions following cerebral infarction: Secondary | ICD-10-CM | POA: Diagnosis not present

## 2017-12-06 DIAGNOSIS — I10 Essential (primary) hypertension: Secondary | ICD-10-CM | POA: Diagnosis not present

## 2017-12-06 DIAGNOSIS — R636 Underweight: Secondary | ICD-10-CM | POA: Diagnosis not present

## 2017-12-06 DIAGNOSIS — R634 Abnormal weight loss: Secondary | ICD-10-CM | POA: Diagnosis not present

## 2017-12-06 DIAGNOSIS — R630 Anorexia: Secondary | ICD-10-CM | POA: Diagnosis not present

## 2017-12-08 DIAGNOSIS — I10 Essential (primary) hypertension: Secondary | ICD-10-CM | POA: Diagnosis not present

## 2017-12-08 DIAGNOSIS — R627 Adult failure to thrive: Secondary | ICD-10-CM | POA: Diagnosis not present

## 2017-12-08 DIAGNOSIS — R636 Underweight: Secondary | ICD-10-CM | POA: Diagnosis not present

## 2017-12-08 DIAGNOSIS — I69318 Other symptoms and signs involving cognitive functions following cerebral infarction: Secondary | ICD-10-CM | POA: Diagnosis not present

## 2017-12-08 DIAGNOSIS — R630 Anorexia: Secondary | ICD-10-CM | POA: Diagnosis not present

## 2017-12-08 DIAGNOSIS — R634 Abnormal weight loss: Secondary | ICD-10-CM | POA: Diagnosis not present

## 2017-12-09 DIAGNOSIS — R636 Underweight: Secondary | ICD-10-CM | POA: Diagnosis not present

## 2017-12-09 DIAGNOSIS — I69318 Other symptoms and signs involving cognitive functions following cerebral infarction: Secondary | ICD-10-CM | POA: Diagnosis not present

## 2017-12-09 DIAGNOSIS — R630 Anorexia: Secondary | ICD-10-CM | POA: Diagnosis not present

## 2017-12-09 DIAGNOSIS — I10 Essential (primary) hypertension: Secondary | ICD-10-CM | POA: Diagnosis not present

## 2017-12-09 DIAGNOSIS — R634 Abnormal weight loss: Secondary | ICD-10-CM | POA: Diagnosis not present

## 2017-12-09 DIAGNOSIS — R627 Adult failure to thrive: Secondary | ICD-10-CM | POA: Diagnosis not present

## 2017-12-10 DIAGNOSIS — R636 Underweight: Secondary | ICD-10-CM | POA: Diagnosis not present

## 2017-12-10 DIAGNOSIS — I10 Essential (primary) hypertension: Secondary | ICD-10-CM | POA: Diagnosis not present

## 2017-12-10 DIAGNOSIS — I69318 Other symptoms and signs involving cognitive functions following cerebral infarction: Secondary | ICD-10-CM | POA: Diagnosis not present

## 2017-12-10 DIAGNOSIS — R630 Anorexia: Secondary | ICD-10-CM | POA: Diagnosis not present

## 2017-12-10 DIAGNOSIS — R634 Abnormal weight loss: Secondary | ICD-10-CM | POA: Diagnosis not present

## 2017-12-10 DIAGNOSIS — R627 Adult failure to thrive: Secondary | ICD-10-CM | POA: Diagnosis not present

## 2017-12-13 DIAGNOSIS — G894 Chronic pain syndrome: Secondary | ICD-10-CM | POA: Diagnosis not present

## 2017-12-13 DIAGNOSIS — M1990 Unspecified osteoarthritis, unspecified site: Secondary | ICD-10-CM | POA: Diagnosis not present

## 2017-12-13 DIAGNOSIS — R627 Adult failure to thrive: Secondary | ICD-10-CM | POA: Diagnosis not present

## 2017-12-13 DIAGNOSIS — I10 Essential (primary) hypertension: Secondary | ICD-10-CM | POA: Diagnosis not present

## 2017-12-13 DIAGNOSIS — I69318 Other symptoms and signs involving cognitive functions following cerebral infarction: Secondary | ICD-10-CM | POA: Diagnosis not present

## 2017-12-13 DIAGNOSIS — R636 Underweight: Secondary | ICD-10-CM | POA: Diagnosis not present

## 2017-12-13 DIAGNOSIS — Z87891 Personal history of nicotine dependence: Secondary | ICD-10-CM | POA: Diagnosis not present

## 2017-12-13 DIAGNOSIS — R630 Anorexia: Secondary | ICD-10-CM | POA: Diagnosis not present

## 2017-12-13 DIAGNOSIS — M419 Scoliosis, unspecified: Secondary | ICD-10-CM | POA: Diagnosis not present

## 2017-12-13 DIAGNOSIS — Z681 Body mass index (BMI) 19 or less, adult: Secondary | ICD-10-CM | POA: Diagnosis not present

## 2017-12-13 DIAGNOSIS — R634 Abnormal weight loss: Secondary | ICD-10-CM | POA: Diagnosis not present

## 2017-12-13 DIAGNOSIS — F0150 Vascular dementia without behavioral disturbance: Secondary | ICD-10-CM | POA: Diagnosis not present

## 2017-12-13 DIAGNOSIS — N3000 Acute cystitis without hematuria: Secondary | ICD-10-CM | POA: Diagnosis not present

## 2017-12-13 DIAGNOSIS — R296 Repeated falls: Secondary | ICD-10-CM | POA: Diagnosis not present

## 2017-12-13 DIAGNOSIS — K279 Peptic ulcer, site unspecified, unspecified as acute or chronic, without hemorrhage or perforation: Secondary | ICD-10-CM | POA: Diagnosis not present

## 2017-12-13 DIAGNOSIS — J449 Chronic obstructive pulmonary disease, unspecified: Secondary | ICD-10-CM | POA: Diagnosis not present

## 2017-12-13 DIAGNOSIS — K219 Gastro-esophageal reflux disease without esophagitis: Secondary | ICD-10-CM | POA: Diagnosis not present

## 2017-12-13 DIAGNOSIS — R011 Cardiac murmur, unspecified: Secondary | ICD-10-CM | POA: Diagnosis not present

## 2017-12-13 DIAGNOSIS — E039 Hypothyroidism, unspecified: Secondary | ICD-10-CM | POA: Diagnosis not present

## 2017-12-13 DIAGNOSIS — R600 Localized edema: Secondary | ICD-10-CM | POA: Diagnosis not present

## 2017-12-15 DIAGNOSIS — R630 Anorexia: Secondary | ICD-10-CM | POA: Diagnosis not present

## 2017-12-15 DIAGNOSIS — R634 Abnormal weight loss: Secondary | ICD-10-CM | POA: Diagnosis not present

## 2017-12-15 DIAGNOSIS — I10 Essential (primary) hypertension: Secondary | ICD-10-CM | POA: Diagnosis not present

## 2017-12-15 DIAGNOSIS — R636 Underweight: Secondary | ICD-10-CM | POA: Diagnosis not present

## 2017-12-15 DIAGNOSIS — I69318 Other symptoms and signs involving cognitive functions following cerebral infarction: Secondary | ICD-10-CM | POA: Diagnosis not present

## 2017-12-15 DIAGNOSIS — R627 Adult failure to thrive: Secondary | ICD-10-CM | POA: Diagnosis not present

## 2017-12-17 DIAGNOSIS — R627 Adult failure to thrive: Secondary | ICD-10-CM | POA: Diagnosis not present

## 2017-12-17 DIAGNOSIS — I69318 Other symptoms and signs involving cognitive functions following cerebral infarction: Secondary | ICD-10-CM | POA: Diagnosis not present

## 2017-12-17 DIAGNOSIS — R636 Underweight: Secondary | ICD-10-CM | POA: Diagnosis not present

## 2017-12-17 DIAGNOSIS — R634 Abnormal weight loss: Secondary | ICD-10-CM | POA: Diagnosis not present

## 2017-12-17 DIAGNOSIS — R630 Anorexia: Secondary | ICD-10-CM | POA: Diagnosis not present

## 2017-12-17 DIAGNOSIS — I10 Essential (primary) hypertension: Secondary | ICD-10-CM | POA: Diagnosis not present

## 2017-12-20 ENCOUNTER — Telehealth: Payer: Self-pay | Admitting: Unknown Physician Specialty

## 2017-12-20 DIAGNOSIS — R636 Underweight: Secondary | ICD-10-CM | POA: Diagnosis not present

## 2017-12-20 DIAGNOSIS — R634 Abnormal weight loss: Secondary | ICD-10-CM | POA: Diagnosis not present

## 2017-12-20 DIAGNOSIS — I10 Essential (primary) hypertension: Secondary | ICD-10-CM | POA: Diagnosis not present

## 2017-12-20 DIAGNOSIS — R630 Anorexia: Secondary | ICD-10-CM | POA: Diagnosis not present

## 2017-12-20 DIAGNOSIS — I69318 Other symptoms and signs involving cognitive functions following cerebral infarction: Secondary | ICD-10-CM | POA: Diagnosis not present

## 2017-12-20 DIAGNOSIS — R627 Adult failure to thrive: Secondary | ICD-10-CM | POA: Diagnosis not present

## 2017-12-20 NOTE — Telephone Encounter (Signed)
Received [facility] from [facility] stating that she is concerned that Allison Bowman's pain medication is being overused.  Caller states that Dr. Dan HumphreysWalker prescribed 90 Oxycodone tablets on 12/13/17 and that there are only 6 pills left in the bottle as of today 12/20/17.  Caller states that she spoke with Allison Bowman (patient's son) and was advised that he has to give Allison Bowman more than 1 tablet every 4 hours as the prescription is written.  Caller states that Allison Bowman is requesting a refill for Oxycodone which was denied by Dr. Dan HumphreysWalker. Allison Bowman would states that she offered/suggested Respite care but Allison Bowman was not interested.

## 2017-12-22 DIAGNOSIS — R630 Anorexia: Secondary | ICD-10-CM | POA: Diagnosis not present

## 2017-12-22 DIAGNOSIS — R627 Adult failure to thrive: Secondary | ICD-10-CM | POA: Diagnosis not present

## 2017-12-22 DIAGNOSIS — R636 Underweight: Secondary | ICD-10-CM | POA: Diagnosis not present

## 2017-12-22 DIAGNOSIS — I69318 Other symptoms and signs involving cognitive functions following cerebral infarction: Secondary | ICD-10-CM | POA: Diagnosis not present

## 2017-12-22 DIAGNOSIS — I10 Essential (primary) hypertension: Secondary | ICD-10-CM | POA: Diagnosis not present

## 2017-12-22 DIAGNOSIS — R634 Abnormal weight loss: Secondary | ICD-10-CM | POA: Diagnosis not present

## 2017-12-24 DIAGNOSIS — R636 Underweight: Secondary | ICD-10-CM | POA: Diagnosis not present

## 2017-12-24 DIAGNOSIS — I10 Essential (primary) hypertension: Secondary | ICD-10-CM | POA: Diagnosis not present

## 2017-12-24 DIAGNOSIS — I69318 Other symptoms and signs involving cognitive functions following cerebral infarction: Secondary | ICD-10-CM | POA: Diagnosis not present

## 2017-12-24 DIAGNOSIS — R634 Abnormal weight loss: Secondary | ICD-10-CM | POA: Diagnosis not present

## 2017-12-24 DIAGNOSIS — R630 Anorexia: Secondary | ICD-10-CM | POA: Diagnosis not present

## 2017-12-24 DIAGNOSIS — R627 Adult failure to thrive: Secondary | ICD-10-CM | POA: Diagnosis not present

## 2017-12-27 DIAGNOSIS — R634 Abnormal weight loss: Secondary | ICD-10-CM | POA: Diagnosis not present

## 2017-12-27 DIAGNOSIS — R627 Adult failure to thrive: Secondary | ICD-10-CM | POA: Diagnosis not present

## 2017-12-27 DIAGNOSIS — I10 Essential (primary) hypertension: Secondary | ICD-10-CM | POA: Diagnosis not present

## 2017-12-27 DIAGNOSIS — I69318 Other symptoms and signs involving cognitive functions following cerebral infarction: Secondary | ICD-10-CM | POA: Diagnosis not present

## 2017-12-27 DIAGNOSIS — R630 Anorexia: Secondary | ICD-10-CM | POA: Diagnosis not present

## 2017-12-27 DIAGNOSIS — R636 Underweight: Secondary | ICD-10-CM | POA: Diagnosis not present

## 2017-12-29 ENCOUNTER — Inpatient Hospital Stay
Admission: EM | Admit: 2017-12-29 | Discharge: 2018-01-13 | DRG: 871 | Disposition: E | Attending: Internal Medicine | Admitting: Internal Medicine

## 2017-12-29 ENCOUNTER — Emergency Department

## 2017-12-29 ENCOUNTER — Inpatient Hospital Stay

## 2017-12-29 ENCOUNTER — Other Ambulatory Visit: Payer: Self-pay

## 2017-12-29 DIAGNOSIS — H9190 Unspecified hearing loss, unspecified ear: Secondary | ICD-10-CM | POA: Diagnosis present

## 2017-12-29 DIAGNOSIS — Z66 Do not resuscitate: Secondary | ICD-10-CM | POA: Diagnosis present

## 2017-12-29 DIAGNOSIS — Z681 Body mass index (BMI) 19 or less, adult: Secondary | ICD-10-CM | POA: Diagnosis not present

## 2017-12-29 DIAGNOSIS — F0390 Unspecified dementia without behavioral disturbance: Secondary | ICD-10-CM | POA: Diagnosis present

## 2017-12-29 DIAGNOSIS — M1990 Unspecified osteoarthritis, unspecified site: Secondary | ICD-10-CM | POA: Diagnosis present

## 2017-12-29 DIAGNOSIS — S72001A Fracture of unspecified part of neck of right femur, initial encounter for closed fracture: Secondary | ICD-10-CM | POA: Diagnosis present

## 2017-12-29 DIAGNOSIS — R7989 Other specified abnormal findings of blood chemistry: Secondary | ICD-10-CM

## 2017-12-29 DIAGNOSIS — J181 Lobar pneumonia, unspecified organism: Secondary | ICD-10-CM

## 2017-12-29 DIAGNOSIS — J690 Pneumonitis due to inhalation of food and vomit: Secondary | ICD-10-CM | POA: Diagnosis not present

## 2017-12-29 DIAGNOSIS — Z7982 Long term (current) use of aspirin: Secondary | ICD-10-CM | POA: Diagnosis not present

## 2017-12-29 DIAGNOSIS — E43 Unspecified severe protein-calorie malnutrition: Secondary | ICD-10-CM | POA: Diagnosis present

## 2017-12-29 DIAGNOSIS — Z8673 Personal history of transient ischemic attack (TIA), and cerebral infarction without residual deficits: Secondary | ICD-10-CM | POA: Diagnosis not present

## 2017-12-29 DIAGNOSIS — R778 Other specified abnormalities of plasma proteins: Secondary | ICD-10-CM

## 2017-12-29 DIAGNOSIS — E039 Hypothyroidism, unspecified: Secondary | ICD-10-CM | POA: Diagnosis present

## 2017-12-29 DIAGNOSIS — R0603 Acute respiratory distress: Secondary | ICD-10-CM

## 2017-12-29 DIAGNOSIS — Z8249 Family history of ischemic heart disease and other diseases of the circulatory system: Secondary | ICD-10-CM | POA: Diagnosis not present

## 2017-12-29 DIAGNOSIS — Z87891 Personal history of nicotine dependence: Secondary | ICD-10-CM

## 2017-12-29 DIAGNOSIS — R627 Adult failure to thrive: Secondary | ICD-10-CM | POA: Diagnosis not present

## 2017-12-29 DIAGNOSIS — Z833 Family history of diabetes mellitus: Secondary | ICD-10-CM | POA: Diagnosis not present

## 2017-12-29 DIAGNOSIS — M810 Age-related osteoporosis without current pathological fracture: Secondary | ICD-10-CM | POA: Diagnosis present

## 2017-12-29 DIAGNOSIS — A419 Sepsis, unspecified organism: Secondary | ICD-10-CM | POA: Diagnosis not present

## 2017-12-29 DIAGNOSIS — D649 Anemia, unspecified: Secondary | ICD-10-CM | POA: Diagnosis present

## 2017-12-29 DIAGNOSIS — R0600 Dyspnea, unspecified: Secondary | ICD-10-CM | POA: Diagnosis not present

## 2017-12-29 DIAGNOSIS — R636 Underweight: Secondary | ICD-10-CM | POA: Diagnosis not present

## 2017-12-29 DIAGNOSIS — R0902 Hypoxemia: Secondary | ICD-10-CM

## 2017-12-29 DIAGNOSIS — G9341 Metabolic encephalopathy: Secondary | ICD-10-CM | POA: Diagnosis not present

## 2017-12-29 DIAGNOSIS — J189 Pneumonia, unspecified organism: Secondary | ICD-10-CM

## 2017-12-29 DIAGNOSIS — J449 Chronic obstructive pulmonary disease, unspecified: Secondary | ICD-10-CM | POA: Diagnosis present

## 2017-12-29 DIAGNOSIS — R634 Abnormal weight loss: Secondary | ICD-10-CM | POA: Diagnosis not present

## 2017-12-29 DIAGNOSIS — I10 Essential (primary) hypertension: Secondary | ICD-10-CM | POA: Diagnosis present

## 2017-12-29 DIAGNOSIS — J9601 Acute respiratory failure with hypoxia: Secondary | ICD-10-CM | POA: Diagnosis present

## 2017-12-29 DIAGNOSIS — R630 Anorexia: Secondary | ICD-10-CM | POA: Diagnosis not present

## 2017-12-29 DIAGNOSIS — K219 Gastro-esophageal reflux disease without esophagitis: Secondary | ICD-10-CM | POA: Diagnosis present

## 2017-12-29 DIAGNOSIS — M419 Scoliosis, unspecified: Secondary | ICD-10-CM | POA: Diagnosis present

## 2017-12-29 DIAGNOSIS — E876 Hypokalemia: Secondary | ICD-10-CM | POA: Diagnosis not present

## 2017-12-29 DIAGNOSIS — T501X5A Adverse effect of loop [high-ceiling] diuretics, initial encounter: Secondary | ICD-10-CM | POA: Diagnosis present

## 2017-12-29 DIAGNOSIS — I69318 Other symptoms and signs involving cognitive functions following cerebral infarction: Secondary | ICD-10-CM | POA: Diagnosis not present

## 2017-12-29 LAB — TROPONIN I: Troponin I: 0.03 ng/mL (ref <0.03)

## 2017-12-29 LAB — BASIC METABOLIC PANEL
Anion gap: 12 (ref 5–15)
BUN: 17 mg/dL (ref 8–23)
CALCIUM: 8.4 mg/dL — AB (ref 8.9–10.3)
CO2: 27 mmol/L (ref 22–32)
CREATININE: 1 mg/dL (ref 0.44–1.00)
Chloride: 106 mmol/L (ref 98–111)
GFR calc Af Amer: 60 mL/min — ABNORMAL LOW (ref >60)
GFR calc non Af Amer: 51 mL/min — ABNORMAL LOW (ref >60)
Glucose, Bld: 125 mg/dL — ABNORMAL HIGH (ref 70–99)
Potassium: 2.4 mmol/L — CL (ref 3.5–5.1)
Sodium: 145 mmol/L (ref 135–145)

## 2017-12-29 LAB — URINALYSIS, COMPLETE (UACMP) WITH MICROSCOPIC
Bacteria, UA: NONE SEEN
Bilirubin Urine: NEGATIVE
GLUCOSE, UA: NEGATIVE mg/dL
Ketones, ur: NEGATIVE mg/dL
Leukocytes, UA: NEGATIVE
Nitrite: NEGATIVE
Protein, ur: NEGATIVE mg/dL
Specific Gravity, Urine: 1.008 (ref 1.005–1.030)
pH: 7 (ref 5.0–8.0)

## 2017-12-29 LAB — LACTIC ACID, PLASMA: LACTIC ACID, VENOUS: 1.7 mmol/L (ref 0.5–1.9)

## 2017-12-29 LAB — CBC WITH DIFFERENTIAL/PLATELET
Basophils Absolute: 0 10*3/uL (ref 0–0.1)
Basophils Relative: 0 %
EOS ABS: 0 10*3/uL (ref 0–0.7)
EOS PCT: 0 %
HCT: 36.1 % (ref 35.0–47.0)
Hemoglobin: 11.6 g/dL — ABNORMAL LOW (ref 12.0–16.0)
LYMPHS ABS: 0.4 10*3/uL — AB (ref 1.0–3.6)
Lymphocytes Relative: 3 %
MCH: 24.9 pg — ABNORMAL LOW (ref 26.0–34.0)
MCHC: 32.3 g/dL (ref 32.0–36.0)
MCV: 77.2 fL — ABNORMAL LOW (ref 80.0–100.0)
MONOS PCT: 4 %
Monocytes Absolute: 0.5 10*3/uL (ref 0.2–0.9)
Neutro Abs: 11.9 10*3/uL — ABNORMAL HIGH (ref 1.4–6.5)
Neutrophils Relative %: 93 %
Platelets: 518 10*3/uL — ABNORMAL HIGH (ref 150–440)
RBC: 4.67 MIL/uL (ref 3.80–5.20)
RDW: 15.3 % — AB (ref 11.5–14.5)
WBC: 12.9 10*3/uL — ABNORMAL HIGH (ref 3.6–11.0)

## 2017-12-29 LAB — POTASSIUM: POTASSIUM: 3.4 mmol/L — AB (ref 3.5–5.1)

## 2017-12-29 LAB — TSH: TSH: 6.862 u[IU]/mL — ABNORMAL HIGH (ref 0.350–4.500)

## 2017-12-29 LAB — MAGNESIUM: Magnesium: 1.5 mg/dL — ABNORMAL LOW (ref 1.7–2.4)

## 2017-12-29 MED ORDER — SODIUM CHLORIDE 0.9 % IV SOLN
500.0000 mg | Freq: Once | INTRAVENOUS | Status: AC
  Administered 2017-12-29: 500 mg via INTRAVENOUS
  Filled 2017-12-29: qty 500

## 2017-12-29 MED ORDER — ASPIRIN EC 325 MG PO TBEC: 325.0000 mg | DELAYED_RELEASE_TABLET | Freq: Every day | ORAL | Status: DC

## 2017-12-29 MED ORDER — MORPHINE SULFATE (PF) 4 MG/ML IV SOLN: 4.0000 mg | INTRAVENOUS | Status: DC | PRN

## 2017-12-29 MED ORDER — SODIUM CHLORIDE 0.9 % IV BOLUS
1000.0000 mL | Freq: Once | INTRAVENOUS | Status: AC
  Administered 2017-12-29: 1000 mL via INTRAVENOUS

## 2017-12-29 MED ORDER — ALBUTEROL SULFATE (2.5 MG/3ML) 0.083% IN NEBU: 2.5000 mg | INHALATION_SOLUTION | RESPIRATORY_TRACT | Status: DC | PRN

## 2017-12-29 MED ORDER — ALBUTEROL SULFATE (2.5 MG/3ML) 0.083% IN NEBU
2.5000 mg | INHALATION_SOLUTION | Freq: Four times a day (QID) | RESPIRATORY_TRACT | Status: DC
  Administered 2017-12-29 – 2017-12-30 (×4): 2.5 mg via RESPIRATORY_TRACT
  Filled 2017-12-29 (×4): qty 3

## 2017-12-29 MED ORDER — ATENOLOL 25 MG PO TABS: 25.0000 mg | ORAL_TABLET | Freq: Every day | ORAL | Status: DC

## 2017-12-29 MED ORDER — SODIUM CHLORIDE 0.9 % IV SOLN: 1.0000 g | INTRAVENOUS | Status: DC

## 2017-12-29 MED ORDER — POTASSIUM CHLORIDE 10 MEQ/100ML IV SOLN
10.0000 meq | Freq: Once | INTRAVENOUS | Status: AC
  Administered 2017-12-29: 10 meq via INTRAVENOUS
  Filled 2017-12-29: qty 100

## 2017-12-29 MED ORDER — ONDANSETRON HCL 4 MG/2ML IJ SOLN: 4.0000 mg | Freq: Four times a day (QID) | INTRAMUSCULAR | Status: DC | PRN

## 2017-12-29 MED ORDER — POTASSIUM CHLORIDE 10 MEQ/100ML IV SOLN
10.0000 meq | INTRAVENOUS | Status: AC
  Administered 2017-12-29 (×4): 10 meq via INTRAVENOUS
  Filled 2017-12-29: qty 100

## 2017-12-29 MED ORDER — ENOXAPARIN SODIUM 40 MG/0.4ML ~~LOC~~ SOLN: 40.0000 mg | SUBCUTANEOUS | Status: DC

## 2017-12-29 MED ORDER — ACETAMINOPHEN 325 MG PO TABS: 650.0000 mg | ORAL_TABLET | Freq: Four times a day (QID) | ORAL | Status: DC | PRN

## 2017-12-29 MED ORDER — ACETAMINOPHEN 650 MG RE SUPP: 650.0000 mg | Freq: Four times a day (QID) | RECTAL | Status: DC | PRN

## 2017-12-29 MED ORDER — DEXTROSE 5 % IV SOLN: 250.0000 mg | INTRAVENOUS | Status: DC

## 2017-12-29 MED ORDER — CLOBETASOL PROPIONATE 0.05 % EX CREA
TOPICAL_CREAM | Freq: Two times a day (BID) | CUTANEOUS | Status: DC
  Administered 2017-12-29 (×2): via TOPICAL
  Filled 2017-12-29: qty 15

## 2017-12-29 MED ORDER — PIPERACILLIN-TAZOBACTAM 3.375 G IVPB
3.3750 g | Freq: Three times a day (TID) | INTRAVENOUS | Status: DC
  Administered 2017-12-29 – 2017-12-30 (×3): 3.375 g via INTRAVENOUS
  Filled 2017-12-29 (×3): qty 50

## 2017-12-29 MED ORDER — ONDANSETRON HCL 4 MG PO TABS: 4.0000 mg | ORAL_TABLET | Freq: Four times a day (QID) | ORAL | Status: DC | PRN

## 2017-12-29 MED ORDER — OXYCODONE HCL 5 MG PO TABS: 5.0000 mg | ORAL_TABLET | Freq: Four times a day (QID) | ORAL | Status: DC | PRN

## 2017-12-29 MED ORDER — POTASSIUM CHLORIDE 10 MEQ/100ML IV SOLN
10.0000 meq | INTRAVENOUS | Status: AC
  Administered 2017-12-29 – 2017-12-30 (×3): 10 meq via INTRAVENOUS
  Filled 2017-12-29: qty 100

## 2017-12-29 MED ORDER — MORPHINE SULFATE (PF) 2 MG/ML IV SOLN
0.5000 mg | INTRAVENOUS | Status: DC | PRN
  Administered 2017-12-30: 0.5 mg via INTRAVENOUS
  Filled 2017-12-29: qty 1

## 2017-12-29 MED ORDER — FUROSEMIDE 10 MG/ML IJ SOLN
20.0000 mg | Freq: Two times a day (BID) | INTRAMUSCULAR | Status: DC
  Administered 2017-12-29 – 2017-12-30 (×2): 20 mg via INTRAVENOUS
  Filled 2017-12-29 (×2): qty 4

## 2017-12-29 MED ORDER — SODIUM CHLORIDE 0.9 % IV SOLN
1.0000 g | Freq: Once | INTRAVENOUS | Status: AC
  Administered 2017-12-29: 1 g via INTRAVENOUS
  Filled 2017-12-29: qty 10

## 2017-12-29 MED ORDER — POTASSIUM CHLORIDE 10 MEQ/100ML IV SOLN
10.0000 meq | Freq: Once | INTRAVENOUS | Status: DC
  Filled 2017-12-29: qty 100

## 2017-12-29 MED ORDER — FUROSEMIDE 20 MG PO TABS: 20.0000 mg | ORAL_TABLET | Freq: Every day | ORAL | Status: DC | PRN

## 2017-12-29 MED ORDER — ENOXAPARIN SODIUM 30 MG/0.3ML ~~LOC~~ SOLN: 30.0000 mg | SUBCUTANEOUS | Status: DC

## 2017-12-29 MED ORDER — LORAZEPAM 2 MG/ML IJ SOLN
0.5000 mg | Freq: Once | INTRAMUSCULAR | Status: AC
  Administered 2017-12-29: 0.5 mg via INTRAVENOUS
  Filled 2017-12-29: qty 1

## 2017-12-29 MED ORDER — LISINOPRIL 5 MG PO TABS: 5.0000 mg | ORAL_TABLET | Freq: Every day | ORAL | Status: DC

## 2017-12-29 MED ORDER — CLINDAMYCIN PHOSPHATE 600 MG/50ML IV SOLN
600.0000 mg | Freq: Two times a day (BID) | INTRAVENOUS | Status: DC
  Filled 2017-12-29: qty 50

## 2017-12-29 MED ORDER — MORPHINE SULFATE (PF) 2 MG/ML IV SOLN: 0.5000 mg | Freq: Once | INTRAVENOUS | Status: DC

## 2017-12-29 MED ORDER — DOCUSATE SODIUM 100 MG PO CAPS
100.0000 mg | ORAL_CAPSULE | Freq: Two times a day (BID) | ORAL | Status: DC
  Administered 2017-12-29: 100 mg via ORAL
  Filled 2017-12-29: qty 1

## 2017-12-29 MED ORDER — DIPHENHYDRAMINE HCL 25 MG PO TABS
25.0000 mg | ORAL_TABLET | Freq: Every evening | ORAL | Status: DC | PRN
  Filled 2017-12-29: qty 1

## 2017-12-29 MED ORDER — CLINDAMYCIN PHOSPHATE 600 MG/50ML IV SOLN
600.0000 mg | Freq: Once | INTRAVENOUS | Status: AC
  Administered 2017-12-29: 600 mg via INTRAVENOUS
  Filled 2017-12-29: qty 50

## 2017-12-29 MED ORDER — MAGNESIUM SULFATE 2 GM/50ML IV SOLN
2.0000 g | Freq: Once | INTRAVENOUS | Status: AC
  Administered 2017-12-29: 2 g via INTRAVENOUS
  Filled 2017-12-29: qty 50

## 2017-12-29 NOTE — ED Provider Notes (Addendum)
[facility] Emergency Department Provider Note   ____________________________________________   First MD Initiated Contact with Patient 01/21/2018 [phone]     (approximate)  I have reviewed the triage vital signs and the nursing notes.   HISTORY  Chief Complaint Shortness of Breath  Level 5 caveat: Patient is nonverbal; majority of history obtained by her son  HPI Allison Bowman is a 84 y.o. female brought to the ED from home via EMS with a chief complaint of respiratory distress.  Son states patient was eating at home and thinks she may have aspirated.  Over the course of the night patient began to cough and have respiratory distress.  Son states patient is on hospice but is not sure why.  She does not normally use oxygen.  EMS reports room air saturations in the low 80%'s.  Comes up to 92% on 4 L nasal cannula oxygen.  Son denies recent fever, chest pain, abdominal pain, vomiting, diarrhea.  Denies recent travel or trauma.   Past Medical History:  Diagnosis Date  . Anemia   . Arthritis   . COPD (chronic obstructive pulmonary disease) (HCC)   . Dementia   . GERD (gastroesophageal reflux disease)   . Hearing loss   . HTN (hypertension)   . IFG (impaired fasting glucose)   . Osteoporosis   . Palpitations   . Scoliosis   . Stroke [facility])     Patient Active Problem List   Diagnosis Date Noted  . Aspiration pneumonia (HCC) 2018-01-21  . Pedal edema 09/27/2017  . Failure to thrive in adult 09/14/2017  . Very poor mobility 09/14/2017  . DNR (do not resuscitate) 11/23/2016  . Balance disorder 05/06/2016  . Underweight 10/01/2015  . Advanced directives, counseling/discussion 04/02/2015  . Dementia 12/26/2014  . Anemia 12/26/2014  . IFG (impaired fasting glucose) 12/26/2014  . Chronic pain syndrome 12/26/2014  . Heart murmur 12/26/2014  . CVA (cerebral infarction) 12/26/2014  . Hypothyroidism 12/26/2014  . Osteoarthritis 12/26/2014  . HTN  (hypertension) 11/30/2014  . Closed left hip fracture (HCC) 11/30/2014  . Hip fracture (HCC) 11/30/2014  . Gastroesophageal reflux disease without esophagitis 11/30/2014  . Stroke (HCC) 11/30/2014  . COPD (chronic obstructive pulmonary disease) (HCC) 11/30/2014  . Urinary retention     Past Surgical History:  Procedure Laterality Date  . HIP SURGERY Left   . INTRAMEDULLARY (IM) NAIL INTERTROCHANTERIC Left 11/30/2014   Procedure: INTRAMEDULLARY (IM) NAIL INTERTROCHANTRIC Left short ;  Surgeon: Deeann Saint, MD;  Location: [facility] ORS;  Service: Orthopedics;  Laterality: Left;    Prior to Admission medications   Medication Sig Start Date End Date Taking? Authorizing Provider  acetaminophen (TYLENOL) 325 MG tablet Take 2 tablets (650 mg total) by mouth every 6 (six) hours as needed for mild pain (or Fever >/= 101). 12/03/14  Yes Delfino Lovett, MD  albuterol (PROVENTIL HFA;VENTOLIN HFA) 108 (90 Base) MCG/ACT inhaler Inhale 2 puffs into the lungs every 6 (six) hours as needed for wheezing or shortness of breath. 10/11/17  Yes Gabriel Cirri, NP  aspirin EC 325 MG tablet Take 325 mg by mouth daily.   Yes [provider]  atenolol (TENORMIN) 25 MG tablet Take 1 tablet (25 mg total) by mouth daily. 09/15/17  Yes Gabriel Cirri, NP  diphenhydrAMINE (BENADRYL) 25 MG tablet Take 25 mg by mouth at bedtime as needed for sleep.   Yes [provider]  furosemide (LASIX) 20 MG tablet Take 1 tablet (20 mg total) by mouth daily as  needed. Patient taking differently: Take 20 mg by mouth daily as needed for fluid.  10/07/17  Yes Crissman, Redge Gainer, MD  lisinopril (PRINIVIL,ZESTRIL) 5 MG tablet Take 1 tablet (5 mg total) by mouth daily. 03/08/17  Yes Gabriel Cirri, NP  oxyCODONE (OXY IR/ROXICODONE) 5 MG immediate release tablet Take 1 tablet (5 mg total) by mouth every 6 (six) hours as needed for severe pain. 10/11/17  Yes Gabriel Cirri, NP  clobetasol cream (TEMOVATE) 0.05 % Apply topically 2 (two)  times daily. 03/08/17   Gabriel Cirri, NP    Allergies Lovastatin  Family History  Problem Relation Age of Onset  . Hyperlipidemia Daughter   . Hypertension Daughter   . Diabetes Daughter     Social History Social History   Tobacco Use  . Smoking status: Former Smoker    Years: 53.00    Types: Cigarettes    Last attempt to quit: 01/09/2006    Years since quitting: 11.9  . Smokeless tobacco: Never Used  Substance Use Topics  . Alcohol use: No    Alcohol/week: 0.0 oz  . Drug use: No    Review of Systems  Constitutional: No fever/chills Eyes: No visual changes. ENT: No sore throat. Cardiovascular: Denies chest pain. Respiratory: Positive for shortness of breath. Gastrointestinal: No abdominal pain.  No nausea, no vomiting.  No diarrhea.  No constipation. Genitourinary: Negative for dysuria. Musculoskeletal: Negative for back pain. Skin: Negative for rash. Neurological: Negative for headaches, focal weakness or numbness.   ____________________________________________   PHYSICAL EXAM:  VITAL SIGNS: ED Triage Vitals  Enc Vitals Group     BP --      Pulse Rate 01/27/2018 0358 (!) 115     Resp 01-27-2018 0358 (!) 26     Temp 27-Jan-2018 0358 97.7 F (36.5 C)     Temp Source 2018-01-27 0358 Oral     SpO2 2018-01-27 0358 92 %     Weight 2018/01/27 0359 83 lb (37.6 kg)     Height 01/27/2018 0359 4\' 10"  (1.473 m)     Head Circumference --      Peak Flow --      Pain Score 2018/01/27 0359 0     Pain Loc --      Pain Edu? --      Excl. in GC? --     Constitutional: Alert.  Frail appearing and in moderate acute distress. Eyes: Conjunctivae are normal. PERRL. EOMI. Head: Atraumatic. Nose: No congestion/rhinnorhea. Mouth/Throat: Mucous membranes are moist.  Oropharynx non-erythematous. Neck: No stridor.   Cardiovascular: Tachycardic rate, regular rhythm. Grossly normal heart sounds.  Good peripheral circulation. Respiratory: Increased respiratory effort.  No retractions. Lungs  with rhonchi. Gastrointestinal: Soft and nontender. No distention. No abdominal bruits. No CVA tenderness. Musculoskeletal: No lower extremity tenderness nor edema.  No joint effusions. Neurologic: Nonverbal at baseline; utters nonsensical noises. Skin:  Skin is warm, dry and intact. No rash noted. Psychiatric: Mood and affect are normal. Speech and behavior are normal.  ____________________________________________   LABS (all labs ordered are listed, but only abnormal results are displayed)  Labs Reviewed  CBC WITH DIFFERENTIAL/PLATELET - Abnormal; Notable for the following components:      Result Value   WBC 12.9 (*)    Hemoglobin 11.6 (*)    MCV 77.2 (*)    MCH 24.9 (*)    RDW 15.3 (*)    Platelets 518 (*)    Neutro Abs 11.9 (*)    Lymphs Abs 0.4 (*)  All other components within normal limits  BASIC METABOLIC PANEL - Abnormal; Notable for the following components:   Potassium 2.4 (*)    Glucose, Bld 125 (*)    Calcium 8.4 (*)    GFR calc non Af Amer 51 (*)    GFR calc Af Amer 60 (*)    All other components within normal limits  TROPONIN I - Abnormal; Notable for the following components:   Troponin I 0.03 (*)    All other components within normal limits  URINALYSIS, COMPLETE (UACMP) WITH MICROSCOPIC - Abnormal; Notable for the following components:   Color, Urine STRAW (*)    APPearance CLEAR (*)    Hgb urine dipstick MODERATE (*)    All other components within normal limits  CULTURE, BLOOD (ROUTINE X 2)  CULTURE, BLOOD (ROUTINE X 2)  URINE CULTURE  LACTIC ACID, PLASMA  LACTIC ACID, PLASMA   ____________________________________________  EKG  ED ECG REPORT I, Josi Roediger J, the attending physician, personally viewed and interpreted this ECG.   Date: 12/15/2017  EKG Time: 0358  Rate: 115  Rhythm: Junctional tachycardia  Axis: LAD  Intervals:right bundle branch block  ST&T Change: Nonspecific  ____________________________________________  RADIOLOGY  ED  MD interpretation: Left lower lobe pneumonia  Official radiology report(s): Dg Chest Port 1 View  Result Date: 01/08/2018 CLINICAL DATA:  84 y/o  F; respiratory distress. EXAM: PORTABLE CHEST 1 VIEW COMPARISON:  08/29/2016 chest radiograph. FINDINGS: Normal cardiac silhouette. Aortic atherosclerosis with calcification. Stable diffuse chronic reticular opacities of the lungs. Increased opacity at the left lung base. No pleural effusion or pneumothorax. Multiple chronic right-sided rib fractures. Bones are diffusely demineralized. IMPRESSION: 1. Left lung base opacity, possibly atelectasis or pneumonia. Consider lateral radiographs for further characterization. 2. Stable diffuse coarse reticular opacities of the lungs, likely chronic interstitial and bronchitic changes. 3. Aortic atherosclerosis. Electronically Signed   By: Mitzi HansenLance  Furusawa-Stratton M.D.   On: 12/22/2017 05:06   Dg Femur Min 2 Views Right  Result Date: 12/21/2017 CLINICAL DATA:  84 year old female with right leg pain. EXAM: RIGHT FEMUR 2 VIEWS COMPARISON:  Right hip radiograph dated 09/07/2017 FINDINGS: There is a displaced fracture of the right femoral neck with proximal migration of the femoral shaft. No dislocation. The bones are osteopenic. Vascular calcifications noted. The soft tissues are grossly unremarkable. IMPRESSION: Displaced right femoral neck fracture. Further evaluation with dedicated right hip radiograph recommended. Electronically Signed   By: Elgie CollardArash  Radparvar M.D.   On: 12/14/2017 06:29    ____________________________________________   PROCEDURES  Procedure(s) performed: None  Procedures  Critical Care performed: Yes, see critical care note(s)   CRITICAL CARE Performed by: Irean HongSUNG,Layten Aiken J   Total critical care time: 45 minutes  Critical care time was exclusive of separately billable procedures and treating other patients.  Critical care was necessary to treat or prevent imminent or life-threatening  deterioration.  Critical care was time spent personally by me on the following activities: development of treatment plan with patient and/or surrogate as well as nursing, discussions with consultants, evaluation of patient's response to treatment, examination of patient, obtaining history from patient or surrogate, ordering and performing treatments and interventions, ordering and review of laboratory studies, ordering and review of radiographic studies, pulse oximetry and re-evaluation of patient's condition.  ____________________________________________   INITIAL IMPRESSION / ASSESSMENT AND PLAN / ED COURSE  As part of my medical decision making, I reviewed the following data within the electronic MEDICAL RECORD NUMBER History obtained from family, Nursing notes reviewed and incorporated,  Labs reviewed, EKG interpreted, Old chart reviewed, Radiograph reviewed, Discussed with admitting physician and Notes from prior ED visits   84 year old female who presents in respiratory distress; likely aspiration. Differential includes, but is not limited to, viral syndrome, bronchitis including COPD exacerbation, pneumonia, reactive airway disease including asthma, CHF including exacerbation with or without pulmonary/interstitial edema, pneumothorax, ACS, thoracic trauma, and pulmonary embolism.  Will obtain septic work-up including blood cultures and lactate; chest x-ray.   Clinical Course as of Dec 29 745  Wed 2018-01-09  0513 Son requesting x-ray of patient's right hip.  Describes right femur pain for some time after patient's hip surgery.  Have noted normal lactate.  Will administer IV Rocephin and IV azithromycin for community-acquired pneumonia.  Will discuss with hospitalist to evaluate patient in the emergency department for admission.   [JS]  0516 Clindamycin ordered for aspiration.   [JS]  [id number] Noted femur x-ray concerning for right femoral neck fracture.  Will send patient for dedicated hip  x-rays.  I have relayed this information directly to attending Dr. Imogene Burn via telephone.   [JS]    Clinical Course User Index [JS] Irean Hong, MD     ____________________________________________   FINAL CLINICAL IMPRESSION(S) / ED DIAGNOSES  Final diagnoses:  Hypoxia  Community acquired pneumonia of left lower lobe of lung (HCC)  Respiratory distress  Hypokalemia  Aspiration pneumonia of left lower lobe, unspecified aspiration pneumonia type (HCC)  Elevated troponin  Closed fracture of neck of right femur, initial encounter [facility])     ED Discharge Orders    None       Note:  This document was prepared using Dragon voice recognition software and may include unintentional dictation errors.    Irean Hong, MD 01-09-2018 Darliss Ridgel    Irean Hong, MD Jan 09, 2018 [phone]

## 2017-12-29 NOTE — Progress Notes (Addendum)
SLP Cancellation Note  Patient Details Name: Allison Bowman MRN: [id number] DOB: 28-Sep-1933   Cancelled treatment:       Reason Eval/Treat Not Completed: Fatigue/lethargy limiting ability to participate;Patient's level of consciousness;Patient not medically ready(chart reviewed; consulted NSG then met w/ pt, son). Pt is too lethargic for safe participation in BSE OR for any oral intake (medications, ice) d/t high risk for aspiration. At rest, pt is presenting w/ lingual protrusion and little to no response to tactile/verbal stim (min groan and hand jerk noted).  Discussed the above w/ Son and NSG; ST services recommends strict NPO status at this time d/t high risk for aspiration. ST services will f/u tomorrow for BSE if pt's status is appropriate. Recommend oral swabs when awake for oral hygiene and stimulation of swallowing.    Orinda Kenner, Rachel, CCC-SLP Avannah Decker 01/07/2018, 12:51 PM

## 2017-12-29 NOTE — Progress Notes (Signed)
Pt received to room 159 via stretcher from ED. Pt lethargic. Nonverbal at this time. Respirations labored, shallow. Lungs coarse. No family present at this time.

## 2017-12-29 NOTE — Consult Note (Signed)
Chart and x-rays reviewed. Detailed note to follow. The femoral neck fracture is of indeterminate age based on the films. She is not a surgical candidate at this time based on her current sepsis, potassium level and aspiration pneumonia.

## 2017-12-29 NOTE — ED Notes (Signed)
Date and time results received: 12/22/2017 5:28 AM (use smartphrase ".now" to insert current time)  Test: Potassium, Troponin Critical Value: K: 2.4, Trop: .03  Name of Provider Notified: Dr Dolores FrameSung

## 2017-12-29 NOTE — Progress Notes (Signed)
PHARMACY ELECTROLYTE MANAGEMENT CONSULT  Pharmacy Consult for electrolyte management Indication: hypokalemia  Labs: Sodium (mmol/L)  Date Value  12/31/2017 145  11/23/2016 142  06/12/2014 140   Potassium (mmol/L)  Date Value  12/26/2017 3.4 (L)  06/12/2014 3.8   Magnesium (mg/dL)  Date Value  57/84/696207/09/2017 1.5 (L)   Calcium (mg/dL)  Date Value  95/28/413207/01/2018 8.4 (L)   Calcium, Total (mg/dL)  Date Value  44/01/027212/29/2015 8.3 (L)    Recent Labs    12/29/17 0423  WBC 12.9*  HGB 11.6*  HCT 36.1  PLT 518*  CREATININE 1.00  MG 1.5*   Estimated Creatinine Clearance: 26.2 mL/min (by C-G formula based on SCr of 1 mg/dL).    Assessment: 84 yo female admitted with aspiration pneumonia. Pharmacy has been consulted to manage electrolytes.  Goal of Therapy:  K= 3.5 - 5 Mg = 1.7 - 2.4  Plan:  Potassium chloride 40 meq oral once and recheck in AM.   Luisa HartScott Jennavecia Schwier, PharmD Clinical Pharmacist  12/29/2017,10:26 PM

## 2017-12-29 NOTE — Progress Notes (Signed)
Pharmacy Antibiotic Note  Allison Bowman is a 84 y.o. female admitted on 12/18/2017 with aspiration pneumonia. Pharmacy has been consulted for Zosyn dosing.  Plan: Zosyn 3.375g IV q8h (4 hour infusion).  Height: 4\' 10"  (147.3 cm) Weight: 83 lb (37.6 kg) IBW/kg (Calculated) : 40.9  Temp (24hrs), Avg:98 F (36.7 C), Min:97.7 F (36.5 C), Max:98.2 F (36.8 C)  Recent Labs  Lab 12/31/2017 0423  WBC 12.9*  CREATININE 1.00  LATICACIDVEN 1.7    Estimated Creatinine Clearance: 26.2 mL/min (by C-G formula based on SCr of 1 mg/dL).    Allergies  Allergen Reactions  . Lovastatin Shortness Of Breath    Antimicrobials this admission: Azithromycin x 1 Ceftriaxone x 1 Clindamycin x 1 Zosyn 7/17 >>   Dose adjustments this admission:   Microbiology results: BCx: 7/17 >> NGTD UCx:7/17 >> sent   Thank you for allowing pharmacy to be a part of this patient's care.  Allison EdouardHannah C Lakeisa Bowman 12/25/2017 12:31 PM

## 2017-12-29 NOTE — H&P (Addendum)
Allison Bowman is an 84 y.o. female.   Chief Complaint: Shortness of breath HPI: The patient with past medical history of stroke, hypertension, COPD and dementia presents to the emergency department with shortness of breath.  The patient's son states that his mom is usually more interactive than she has been the last few days.  He also reports that she has not been walking for at least a week as well.  He is concerned that she has hurt her leg but also notes that his mother's breathing has become louder.  He admits to hearing a choking noise prior to observing his mother's breathing pattern which prompted him to seek evaluation in the emergency department.  Chest x-ray showed aspiration pneumonia.  X-ray of the patient's leg also shows displaced right femur fracture.  Due to aforementioned reasons the emergency department staff, hospitalist service for admission.  Past Medical History:  Diagnosis Date  . Anemia   . Arthritis   . COPD (chronic obstructive pulmonary disease) ([place])   . Dementia   . GERD (gastroesophageal reflux disease)   . Hearing loss   . HTN (hypertension)   . IFG (impaired fasting glucose)   . Osteoporosis   . Palpitations   . Scoliosis   . Stroke [facility])     Past Surgical History:  Procedure Laterality Date  . HIP SURGERY Left   . INTRAMEDULLARY (IM) NAIL INTERTROCHANTERIC Left 11/30/2014   Procedure: INTRAMEDULLARY (IM) NAIL INTERTROCHANTRIC Left short ;  Surgeon: Earnestine Leys, MD;  Location: [facility] ORS;  Service: Orthopedics;  Laterality: Left;    Family History  Problem Relation Age of Onset  . Hyperlipidemia Daughter   . Hypertension Daughter   . Diabetes Daughter    Social History:  reports that she quit smoking about 11 years ago. Her smoking use included cigarettes. She quit after 53.00 years of use. She has never used smokeless tobacco. She reports that she does not drink alcohol or use drugs.  Allergies:  Allergies  Allergen Reactions  . Lovastatin  Shortness Of Breath    Medications Prior to Admission  Medication Sig Dispense Refill  . acetaminophen (TYLENOL) 325 MG tablet Take 2 tablets (650 mg total) by mouth every 6 (six) hours as needed for mild pain (or Fever >/= 101). 15 tablet 0  . albuterol (PROVENTIL HFA;VENTOLIN HFA) 108 (90 Base) MCG/ACT inhaler Inhale 2 puffs into the lungs every 6 (six) hours as needed for wheezing or shortness of breath. 1 Inhaler 0  . aspirin EC 325 MG tablet Take 325 mg by mouth daily.    Marland Kitchen atenolol (TENORMIN) 25 MG tablet Take 1 tablet (25 mg total) by mouth daily. 90 tablet 1  . diphenhydrAMINE (BENADRYL) 25 MG tablet Take 25 mg by mouth at bedtime as needed for sleep.    . furosemide (LASIX) 20 MG tablet Take 1 tablet (20 mg total) by mouth daily as needed. (Patient taking differently: Take 20 mg by mouth daily as needed for fluid. ) 30 tablet 0  . lisinopril (PRINIVIL,ZESTRIL) 5 MG tablet Take 1 tablet (5 mg total) by mouth daily. 90 tablet 3  . oxyCODONE (OXY IR/ROXICODONE) 5 MG immediate release tablet Take 1 tablet (5 mg total) by mouth every 6 (six) hours as needed for severe pain. 30 tablet 0  . clobetasol cream (TEMOVATE) 0.05 % Apply topically 2 (two) times daily. 30 g 0    Results for orders placed or performed during the hospital encounter of 12/24/2017 (from the past 48 hour(s))  Culture, blood (routine x 2)     Status: None (Preliminary result)   Collection Time: 12/24/2017  4:23 AM  Result Value Ref Range   Specimen Description BLOOD LEFT ANTECUBITAL    Special Requests      BOTTLES DRAWN AEROBIC AND ANAEROBIC Blood Culture results may not be optimal due to an excessive volume of blood received in culture bottles   Culture      NO GROWTH <12 HOURS Performed at [facility], [address] [place], [place] [address]    Report Status PENDING   Culture, blood (routine x 2)     Status: None (Preliminary result)   Collection Time: 12/31/2017  4:23 AM  Result Value Ref Range    Specimen Description BLOOD RIGHT ANTECUBITAL    Special Requests      BOTTLES DRAWN AEROBIC AND ANAEROBIC Blood Culture results may not be optimal due to an excessive volume of blood received in culture bottles   Culture      NO GROWTH <12 HOURS Performed at [facility], [address] [place], [place] [address]    Report Status PENDING   CBC with Differential     Status: Abnormal   Collection Time: 12/25/2017  4:23 AM  Result Value Ref Range   WBC 12.9 (H) 3.6 - 11.0 K/uL   RBC 4.67 3.80 - 5.20 MIL/uL   Hemoglobin 11.6 (L) 12.0 - 16.0 g/dL   HCT 36.1 35.0 - 47.0 %   MCV 77.2 (L) 80.0 - 100.0 fL   MCH 24.9 (L) 26.0 - 34.0 pg   MCHC 32.3 32.0 - 36.0 g/dL   RDW 15.3 (H) 11.5 - 14.5 %   Platelets 518 (H) 150 - 440 K/uL   Neutrophils Relative % 93 %   Neutro Abs 11.9 (H) 1.4 - 6.5 K/uL   Lymphocytes Relative 3 %   Lymphs Abs 0.4 (L) 1.0 - 3.6 K/uL   Monocytes Relative 4 %   Monocytes Absolute 0.5 0.2 - 0.9 K/uL   Eosinophils Relative 0 %   Eosinophils Absolute 0.0 0 - 0.7 K/uL   Basophils Relative 0 %   Basophils Absolute 0.0 0 - 0.1 K/uL    Comment: Performed at [facility], [address] [place], [place] [address]  Basic metabolic panel     Status: Abnormal   Collection Time: 01/06/2018  4:23 AM  Result Value Ref Range   Sodium 145 135 - 145 mmol/L   Potassium 2.4 (LL) 3.5 - 5.1 mmol/L    Comment: CRITICAL RESULT CALLED TO, READ BACK BY AND VERIFIED WITH COLE AMORIELLO ON 12/13/2017 AT 0526 JAG    Chloride 106 98 - 111 mmol/L    Comment: Please note change in reference range.   CO2 27 22 - 32 mmol/L   Glucose, Bld 125 (H) 70 - 99 mg/dL    Comment: Please note change in reference range.   BUN 17 8 - 23 mg/dL    Comment: Please note change in reference range.   Creatinine, Ser 1.00 0.44 - 1.00 mg/dL   Calcium 8.4 (L) 8.9 - 10.3 mg/dL   GFR calc non Af Amer 51 (L) >60 mL/min   GFR calc Af Amer 60 (L) >60 mL/min    Comment: (NOTE) The eGFR has been  calculated using the CKD EPI equation. This calculation has not been validated in all clinical situations. eGFR's persistently <60 mL/min signify possible Chronic Kidney Disease.    Anion gap 12 5 - 15  Comment: Performed at [facility], [address] [place], [place] [address]  Troponin I     Status: Abnormal   Collection Time: 12/28/2017  4:23 AM  Result Value Ref Range   Troponin I 0.03 (HH) <0.03 ng/mL    Comment: CRITICAL RESULT CALLED TO, READ BACK BY AND VERIFIED WITH COLE AMORIELLO ON 01/08/2018 AT 0526 JAG Performed at [facility], [address] [place], [place] [address]   Lactic acid, plasma     Status: None   Collection Time: 12/17/2017  4:23 AM  Result Value Ref Range   Lactic Acid, Venous 1.7 0.5 - 1.9 mmol/L    Comment: Performed at [facility], [address] [place], [place] [address]   Dg Chest Port 1 View  Result Date: 01/03/2018 CLINICAL DATA:  84 y/o  F; respiratory distress. EXAM: PORTABLE CHEST 1 VIEW COMPARISON:  08/29/2016 chest radiograph. FINDINGS: Normal cardiac silhouette. Aortic atherosclerosis with calcification. Stable diffuse chronic reticular opacities of the lungs. Increased opacity at the left lung base. No pleural effusion or pneumothorax. Multiple chronic right-sided rib fractures. Bones are diffusely demineralized. IMPRESSION: 1. Left lung base opacity, possibly atelectasis or pneumonia. Consider lateral radiographs for further characterization. 2. Stable diffuse coarse reticular opacities of the lungs, likely chronic interstitial and bronchitic changes. 3. Aortic atherosclerosis. Electronically Signed   By: Kristine Garbe M.D.   On: 12/28/2017 05:06   Dg Femur Min 2 Views Right  Result Date: 12/14/2017 CLINICAL DATA:  84 year old female with right leg pain. EXAM: RIGHT FEMUR 2 VIEWS COMPARISON:  Right hip radiograph dated 09/07/2017 FINDINGS: There is a displaced fracture of the right femoral neck with  proximal migration of the femoral shaft. No dislocation. The bones are osteopenic. Vascular calcifications noted. The soft tissues are grossly unremarkable. IMPRESSION: Displaced right femoral neck fracture. Further evaluation with dedicated right hip radiograph recommended. Electronically Signed   By: Anner Crete M.D.   On: 12/24/2017 06:29    Review of Systems  Unable to perform ROS: Dementia    Blood pressure (!) 161/70, pulse (!) 112, temperature 97.7 F (36.5 C), temperature source Oral, resp. rate (!) 26, height _0  (1.473 m), weight 37.6 kg (83 lb), SpO2 94 %. Physical Exam  Vitals reviewed. Constitutional: She appears well-developed and well-nourished. No distress.  HENT:  Head: Normocephalic and atraumatic.  Mouth/Throat: Oropharynx is clear and moist.  Eyes: Pupils are equal, round, and reactive to light. Conjunctivae and EOM are normal. No scleral icterus.  Neck: Normal range of motion. Neck supple. No JVD present. No tracheal deviation present. No thyromegaly present.  Cardiovascular: Normal rate, regular rhythm and normal heart sounds. Exam reveals no gallop and no friction rub.  No murmur heard. Respiratory: Effort normal. She has rhonchi in the right lower field and the left lower field.  GI: Soft. Bowel sounds are normal. She exhibits no distension. There is no tenderness.  Lymphadenopathy:    She has no cervical adenopathy.  Neurological: She is alert. No cranial nerve deficit. She exhibits normal muscle tone.  Appears to be oriented to family  Skin: Skin is warm and dry.  Psychiatric:  Patient is not following directions and does not communicate with me directly     Assessment/Plan This is a 84 year old female admitted for sepsis. 1.  Sepsis: The patient meets criteria via tachycardia and tachypnea as well as leukocytosis.  She is hemodynamically stable.  Obtain blood cultures and sputum sample if possible.  Continue broad-spectrum antibiotics. 2.  Aspiration pneumonia: Supplemental oxygen as needed.  Continue clindamycin and azithromycin. 3.  Hypertension: Uncontrolled.  Continue lisinopril 4.  Hypokalemia: Likely secondary to Lasix.  Hold the latter.  Replete potassium. 5.  Femur fracture: Consult orthopedic surgery.  Manage severe pain with IV morphine as needed 6.  DVT prophylaxis: None (anticipation of surgery) 7.  GI prophylaxis: None The patient is a DNR.  Time spent on admission was and patient care approximately 45 minutes  Harrie Foreman, MD 12/24/2017, 6:39 AM

## 2017-12-29 NOTE — ED Notes (Signed)
Upon arrival to room pt was holding IV in hand as well as purewick. Dr sung notified. Bleeding controlled.

## 2017-12-29 NOTE — Progress Notes (Addendum)
The patient has labored breathing, is on oxygen 4 L.  She is noncommunicative at this time. Vital signs reviewed.  Still tachycardia.  Physical examinations done.  Bilateral lung sounds crackles.  This is a 84 year old female admitted for sepsis. 1.  Sepsis: The patient meets criteria via tachycardia and tachypnea as well as leukocytosis.  She is hemodynamically stable.  Obtain blood cultures and sputum sample if possible.  Continue broad-spectrum antibiotics. 2.  Acute respiratory failure with hypoxia due to aspiration pneumonia:  Continue oxygen by nasal counter, continue albuterol.  The patient was treated with clindamycin and azithromycin.  Changed to Zosyn IV. 3.  Hypertension: Continue lisinopril 4.  Hypokalemia: Hold Lasix, given potassium supplement.  Follow-up BMP. Hypomagnesemia.  Give IV magnesium and follow-up level. 5.  Femur fracture: Pain control, not a surgical candidate per Dr. Odis LusterBowers.  I discussed with RN and hospice staff and palliative care staff. Time spent about 32 minutes.

## 2017-12-29 NOTE — Progress Notes (Signed)
No charge note.   Chart reviewed. Discussed with Dr. Imogene Burnhen. Will wait to hold goals of care meeting until family hears from ortho.   Thank you for this consult.   Gerlean RenShae Lee Brittie Whisnant, DNP, AGNP-C Palliative Medicine Team Team Phone # [phone]  Pager # [phone]

## 2017-12-29 NOTE — Clinical Social Work Note (Signed)
Clinical Social Work Assessment  Patient Details  Name: Allison Bowman MRN: [id number] Date of Birth: Nov 26, 1933  Date of referral:  12/27/2017               Reason for consult:  Discharge Planning, End of Life/Bow[place]                Permission sought to share information with:    Permission granted to share information::     Name::        Agency::     Relationship::     Contact Information:     Housing/Transportation Living arrangements for the past 2 months:  Single Family Home Source of Information:  Adult Children Patient Interpreter Needed:  None Criminal Activity/Legal Involvement Pertinent to Current Situation/Hospitalization:  No - Comment as needed Significant Relationships:  Adult Children Lives with:  Adult Children Do you feel safe going back to the place where you live?    Need for family participation in patient care:  Yes (Comment)  Care giving concerns:  Patient lives in [place] with her son [place] and is followed by [facility] [facility] at home.     Social Worker assessment / plan:  Visual merchandiserClinical Social Worker (CSW) reviewed chart and noted that patient has a hip fracture. Ortho consult is pending. CSW attempted to meet with patient however she was asleep and not able to participate in assessment. CSW contacted patient's son [place] Bowman. Per son patient lives with him and a hospital bed was delivered today. Per son patient is open with [place]. Son is waiting to speak with Ortho MD about recommendations. Son reported that if patient is not a candidate for surgery he would prefer to take her home. RN case manager and [place] is aware of above. CSW will continue to follow and assist as needed.   Employment status:  Disabled (Comment on whether or not currently receiving Disability), Retired Health and safety inspectornsurance information:  Medic[place], [place] Recommendations:  Not assessed at this time Information / Referral to community resources:  Other  (Comment Required)(Patient will likely D/C home and continue Bowman. )  Patient/Family's Response to care:  Patient's son is waiting to hear from Ortho MD.   Patient/Family's Understanding of and Emotional Response to Diagnosis, Current Treatment, and Prognosis:  Patient's son was very pleasant and thanked CSW for calling.   Emotional Assessment Appearance:  Appears stated age Attitude/Demeanor/Rapport:  Unable to Assess Affect (typically observed):  Unable to Assess Orientation:  Oriented to Self, Fluctuating Orientation (Suspected and/or reported Sundowners) Alcohol / Substance use:  Not Applicable Psych involvement (Current and /or in the community):  No (Comment)  Discharge Needs  Concerns to be addressed:  Discharge Planning Concerns Readmission within the last 30 days:  No Current discharge risk:  Dependent with Mobility, Chronically ill, Cognitively Impaired Barriers to Discharge:  Continued Medical Work up   [employer], Darleen CrockerBailey Bowman, [place]man 12/17/2017, 3:09 PM

## 2017-12-29 NOTE — Progress Notes (Signed)
Visit made. Patient is currently followed by [facility] at home with a hospice diagnosis of Abnormal weight loss. She has a history of dementia and COPD. She is also hard of hearing. [facility] for evaluation of fever and low oxygen saturations accompanied by labored breathing. She has been admitted for aspiration pneumonia and a displaced fracture of the right femoral neck. Orthopedic consult pending. Patient seen lying in bed, knees bent, some moaning noted, no verbal response, other than mumbles. Labored breathing noted. No family at bedside. Son Molly MaduroRobert to come in later this afternoon. Writer spoke with staff RN Waynetta SandyBeth and attending physician Dr. Imogene Burnhen regarding patient's labored breathing. Patient to receive 0.5 mg of morphine for pain and dyspnea/increased work of breathing. Will continue to follow and update hospice team. Dayna BarkerKaren Robertson RN, BSN, [facility], hospital Liaison [phone]

## 2017-12-29 NOTE — Progress Notes (Signed)
Pt resting in bed. More alert at this time. Answers some questions appropriately. Mitts remain on for pt safety. Respirations less labored. Lung sounds improved. No acute distress noted.

## 2017-12-29 NOTE — ED Triage Notes (Signed)
Pt arrived via [facility] from home with c/o Shortness of breath. EMS states that pt was eating at home and started having a coughing fit. EMS states that son called hospice nurse and pt had a slight rattle in the right lung. Pt o2 sats in low 80s per EMS and pt on 4L [place].

## 2017-12-29 NOTE — Progress Notes (Addendum)
PHARMACY ELECTROLYTE MANAGEMENT CONSULT  Pharmacy Consult for electrolyte management Indication: hypokalemia  Labs: Sodium (mmol/L)  Date Value  03/08/18 145  11/23/2016 142  06/12/2014 140   Potassium (mmol/L)  Date Value  03/08/18 2.4 (LL)  06/12/2014 3.8   Calcium (mg/dL)  Date Value  40/98/119109/24/19 8.4 (L)   Calcium, Total (mg/dL)  Date Value  47/82/956212/29/2015 8.3 (L)    Recent Labs    December 02, 2017 0423  WBC 12.9*  HGB 11.6*  HCT 36.1  PLT 518*  CREATININE 1.00   Estimated Creatinine Clearance: 26.2 mL/min (by C-G formula based on SCr of 1 mg/dL).    Assessment: 84 yo female admitted with aspiration pneumonia. Pharmacy has been consulted to manage electrolytes.  She received 3 runs of 10mEq potassium this AM in ED.   Goal of Therapy:  K= 3.5 - 5 Mg = 1.7 - 2.4  Plan:  K = 2.4, subtherapeutic. Patient already received 3 x 10mEq IV K this AM. Will obtain Mg level and give additional 4 x 10mEq K IV. Will get K level one hour after last K run. Mg level pending, will replace if low. Diet currently NPO. Pharmacy will continue to follow and replace as needed.   1445: Mg = 1.5, subtherapeutic. Will replace with Magnesium 2g IV once. Will recheck Mg with AM labs.   Yolanda BonineHannah Lifsey, PharmD 07-Mar-2018,2:12 PM

## 2017-12-29 NOTE — Progress Notes (Signed)
Called the patients son to review Xray results as we had discussed earlier that the patient did not seem to be moving her leg as if it were broken, but that radiology interpretation revealed that the femoral neck is displaced. I explained that she would not be a great surgical candidate right now that she has a pneumonia but that orthopedic surgery would consult and give recommendations in addition to the internal medicine team.  Reassured him that we would manage any signs of pain his mom may have. He expressed understanding and thanked me for the call.

## 2017-12-29 NOTE — ED Notes (Signed)
Unable to get urine with in and out due to obstruction. Dr Dolores FrameSung notified and purewick in place.

## 2017-12-29 NOTE — ED Notes (Signed)
Pt continued to pull at IVs as well as try to get out of bed. Safety sitter has been implemented and MD has been notified.

## 2017-12-29 NOTE — Progress Notes (Signed)
Lovenox dose adjusted to 30 mg daily due to CrCl 26.2 ml/min.  Pharmacy will continue to monitor.

## 2017-12-30 LAB — BASIC METABOLIC PANEL
Anion gap: 13 (ref 5–15)
BUN: 15 mg/dL (ref 8–23)
CHLORIDE: 109 mmol/L (ref 98–111)
CO2: 24 mmol/L (ref 22–32)
CREATININE: 0.96 mg/dL (ref 0.44–1.00)
Calcium: 8.4 mg/dL — ABNORMAL LOW (ref 8.9–10.3)
GFR calc Af Amer: >60 mL/min (ref >60)
GFR calc non Af Amer: 54 mL/min — ABNORMAL LOW (ref >60)
Glucose, Bld: 107 mg/dL — ABNORMAL HIGH (ref 70–99)
Potassium: 3.4 mmol/L — ABNORMAL LOW (ref 3.5–5.1)
SODIUM: 146 mmol/L — AB (ref 135–145)

## 2017-12-30 LAB — URINE CULTURE: Culture: NO GROWTH

## 2017-12-30 LAB — MAGNESIUM: Magnesium: 1.9 mg/dL (ref 1.7–2.4)

## 2017-12-30 MED ORDER — POTASSIUM CHLORIDE 10 MEQ/100ML IV SOLN: 10.0000 meq | INTRAVENOUS | Status: AC

## 2017-12-30 MED ORDER — ALBUTEROL SULFATE (2.5 MG/3ML) 0.083% IN NEBU: 2.5000 mg | INHALATION_SOLUTION | Freq: Two times a day (BID) | RESPIRATORY_TRACT | Status: DC

## 2017-12-30 MED ORDER — POTASSIUM CHLORIDE 10 MEQ/100ML IV SOLN: 10.0000 meq | INTRAVENOUS | Status: DC

## 2017-12-31 ENCOUNTER — Telehealth: Payer: Self-pay | Admitting: Unknown Physician Specialty

## 2017-12-31 NOTE — Telephone Encounter (Signed)
Called son to deliver condolences for his loss.

## 2018-01-03 LAB — CULTURE, BLOOD (ROUTINE X 2)
CULTURE: NO GROWTH
Culture: NO GROWTH

## 2018-01-13 NOTE — Progress Notes (Signed)
The patient was unresponsive, no hear beat, no breath. Pupils dilated. She was pronounced death at 10:48.  I discussed with the patient's son and hospice RN at bedside.

## 2018-01-13 NOTE — Progress Notes (Addendum)
   [facility] [place] [facility]   PATIENT NAME: Allison Bowman    MR#:  [id number]  DATE OF BIRTH:  08-Jun-1934  SUBJECTIVE:  CHIEF COMPLAINT:   Chief Complaint  Patient presents with  . Shortness of Breath   Patient has worsening shortness of breath and cough.  She is confused and demented, unresponsive to stimuli. REVIEW OF SYSTEMS:  Review of Systems  Unable to perform ROS: Mental status change    DRUG ALLERGIES:   Allergies  Allergen Reactions  . Lovastatin Shortness Of Breath   VITALS:  Blood pressure 123/64, pulse (!) 104, temperature 98.1 F (36.7 C), temperature source Oral, resp. rate (!) 22, height 4\' 10"  (1.473 m), weight 68 lb 4.8 oz (31 kg), SpO2 98 %. PHYSICAL EXAMINATION:  Physical Exam  Constitutional:  Severe malnutrition.  HENT:  Head: Normocephalic.  Eyes: Conjunctivae are normal. No scleral icterus.  Neck: Neck supple. No JVD present. No tracheal deviation present.  Cardiovascular: Regular rhythm and normal heart sounds. Exam reveals no gallop.  No murmur heard. Tachycardia.  Pulmonary/Chest: No respiratory distress. She has no wheezes. She has no rales.  Bilateral crackles and use of muscle to breath.  Abdominal: Soft. Bowel sounds are normal. She exhibits no distension. There is no tenderness. There is no rebound.  Musculoskeletal: She exhibits no edema or tenderness.  Neurological:  Confused, responsive to verbal stimuli.  Skin: No rash noted. No erythema.  Vitals reviewed.  LABORATORY PANEL:  Female CBC Recent Labs  Lab 03-21-2018 0423  WBC 12.9*  HGB 11.6*  HCT 36.1  PLT 518*   ------------------------------------------------------------------------------------------------------------------ Chemistries  Recent Labs  Lab 12/18/2017 0317  NA 146*  K 3.4*  CL 109  CO2 24  GLUCOSE 107*  BUN 15  CREATININE 0.96  CALCIUM 8.4*  MG 1.9   RADIOLOGY:  No results found. ASSESSMENT AND PLAN:   This is a  84 year old female admitted for sepsis. 1. Sepsis due to aspiration pneumonia. Continue Zosyn IV, nebulizer. 2. Acute respiratory failure with hypoxia due to aspiration pneumonia:  Continue oxygen by nasal counter, continue albuterol.  The patient was treated with clindamycin and azithromycin.  Changed to Zosyn IV.  3. Hypertension: Continue lisinopril 4.Hypokalemia: Hold Lasix, given potassium supplement.  Follow-up BMP.   Hypomagnesemia.  Given IV magnesium and improved. 5. Femur fracture: Pain control, not a surgical candidate per Dr. Odis LusterBowers. 6.  Severe malnutrition. Acute metabolic encephalopathy due to above.  High risk for aspiration.  N.p.o. for now.  Generally, the patient's condition is declining, she has very poor prognosis and may die any time. I discussed with RN, hospice staff Clydie BraunKaren, waiting for her son to come to make a decision.  The patient is not in comfort care now. All the records are reviewed and case discussed with Care Management/Social Worker. Management plans discussed with the patient, family and they are in agreement.  CODE STATUS: DNR  TOTAL TIME TAKING CARE OF THIS PATIENT: 42 minutes.   More than 50% of the time was spent in counseling/coordination of care: YES  POSSIBLE D/C IN ? DAYS, DEPENDING ON CLINICAL CONDITION.   Shaune PollackQing Dede Dobesh M.D on 01/09/2018 at 10:13 AM  Between 7am to 6pm - Pager - [phone]  After 6pm go to [email or web address] - Therapist, nutritionalpassword EPAS [facility]  [facility] [place] [facility]

## 2018-01-13 NOTE — Progress Notes (Signed)
Son at bedside with hospice RN. Chaplain on the way.

## 2018-01-13 NOTE — Progress Notes (Signed)
Visit made. Writer arrived to patient's room at aprox 9:30 am, patient noted with agonal breathing, color gray, hands cyanotic. Staff RN Candace notified. She reported that patient had been talking with her son earlier, son had left to go home. Writer contacted Mr. Ann Makiarrish via telephone to notify him of patient's sudden change. Mr. Ann Makiarrish arrived back to the room. Writer stayed with patient and her son to provide support. Hospice Chaplain Aquilla HackerGail Smith also came to provide support as son has no other family. Staff RN Candace notified after patient took her last breath. Attending Dr. Imogene Burnhen notified and pronounced patient at 10:48 am. Mr. Ann Makiarrish remained at bedside.  Hospice team notified. Dayna BarkerKaren Robertson RN, BSN, [facility], hospital liaison [phone]

## 2018-01-13 NOTE — Progress Notes (Addendum)
PHARMACY ELECTROLYTE MANAGEMENT CONSULT  Pharmacy Consult for electrolyte management Indication: hypokalemia  Labs: Sodium (mmol/L)  Date Value  12/21/2017 146 (H)  11/23/2016 142  06/12/2014 140   Potassium (mmol/L)  Date Value  12/29/2017 3.4 (L)  06/12/2014 3.8   Magnesium (mg/dL)  Date Value  14/78/295607/01/2018 1.9   Calcium (mg/dL)  Date Value  21/30/865707/30/2019 8.4 (L)   Calcium, Total (mg/dL)  Date Value  84/69/629512/29/2015 8.3 (L)    Recent Labs    02/24/18 0423  0317  WBC 12.9*  --   HGB 11.6*  --   HCT 36.1  --   PLT 518*  --   CREATININE 1.00 0.96  MG 1.5* 1.9   Estimated Creatinine Clearance: 22.5 mL/min (by C-G formula based on SCr of 0.96 mg/dL).    Assessment: 84 yo female admitted with aspiration pneumonia. Pharmacy has been consulted to manage electrolytes.  Goal of Therapy:  K= 3.5 - 5 Mg = 1.7 - 2.4  Plan:  K still at 3.4 despite 3 runs of IV KCL 10 MEQ. Pt was started on lasix IV 20 mg BID last night. Mg is good at 1.9. I will give another 4 runs of KCL 10 MEQ IV bc pt is NPO. Recheck K and Mg in the AM.  Olene FlossMelissa D Ciaran Begay, Pharm.D, BCPS Clinical Pharmacist  ,7:36 AM

## 2018-01-13 NOTE — Discharge Summary (Signed)
   [facility] [place] [facility]   PATIENT NAME: Allison Bowman    MR#:  [id number]  DATE OF BIRTH:  [birth year]  DATE OF ADMISSION:  February 24, 2018   ADMITTING PHYSICIAN: Arnaldo NatalMichael S Diamond, MD  DATE OF DEATH:  10:48 AM 01/04/2018 PRIMARY CARE PHYSICIAN: Gabriel CirriWicker, Cheryl, NP   ADMISSION DIAGNOSIS:  Hypokalemia [E87.6] Respiratory distress [R06.03] Hypoxia [R09.02] Elevated troponin [R74.8] Closed fracture of neck of right femur, initial encounter (HCC) [S72.001A] Aspiration pneumonia of left lower lobe, unspecified aspiration pneumonia type (HCC) [J69.0] Community acquired pneumonia of left lower lobe of lung (HCC) [J18.1] DISCHARGE DIAGNOSIS:  Active Problems:   Aspiration pneumonia (HCC)  SECONDARY DIAGNOSIS:   Past Medical History:  Diagnosis Date  . Anemia   . Arthritis   . COPD (chronic obstructive pulmonary disease) (HCC)   . Dementia   . GERD (gastroesophageal reflux disease)   . Hearing loss   . HTN (hypertension)   . IFG (impaired fasting glucose)   . Osteoporosis   . Palpitations   . Scoliosis   . Stroke [facility](HCC)    HOSPITAL COURSE:   This is a 84 year old female admitted for sepsis. 1. Sepsis due to aspiration pneumonia. She was treated with Zosyn IV, nebulizer. 2. Acuterespiratory failure with hypoxia due toaspiration pneumonia: She was on oxygen by nasal counter, continue albuterol. The patient was treated with clindamycin and azithromycin.Changed to Zosyn IV.  3. Hypertension: She was on lisinopril. 4.Hypokalemia:Hold Lasix, given potassium supplement.   Hypomagnesemia. Given IV magnesium and improved. 5. Femur fracture:Pain control, not asurgical candidate per Dr. Odis LusterBowers. 6.  Severe malnutrition. Acute metabolic encephalopathy due to above.  High risk for aspiration.  N.p.o.  The patient's condition declined, she has very poor prognosis. The patient was unresponsive, no hear beat, no breath. Pupils dilated. She was  pronounced death at 10:48.  I discussed with the patient's son and hospice RN at bedside.   Shaune PollackQing [name] M.D on 12/23/2017 at 11:11 AM  Between 7am to 6pm - Pager - [phone]  After 6pm go to [email or web address] - Social research officer, governmentpassword [email or web address]  [facility] [place] [facility]  Office  [phone]  CC: Primary care physician; Gabriel CirriWicker, Cheryl, NP   Note: This dictation was prepared with Dragon dictation along with smaller phrase technology. Any transcriptional errors that result from this process are unintentional.

## 2018-01-13 NOTE — Plan of Care (Signed)

## 2018-01-13 NOTE — Progress Notes (Signed)
   12-31-17 1115  Clinical Encounter Type  Visited With Family  Visit Type Initial  Referral From Other (Comment) (staff at visitors' entrance)  Spiritual Encounters  Spiritual Needs Emotional;Grief support  Stress Factors  Family Stress Factors Loss;Financial concerns   Responded to page from visitors' entrance that an unnamed man was sitting outside and visibly upset.  Chaplain introduced self and sat next to person on the ground.  He stated that his name was Allison Bowman and that his mother had just died.  When asked, he identified mother's name and room number and reported that he was waiting for his "ex" to come.  Patient's son became tearful as he spoke of conversation he had with mother this morning before he left, discussion around having had hospice bed for patient delivered to his house but now she was 'gone.'  He said he had told the women with him earlier to 'just go ahead and shoot him.'  Chaplain asked him if he was serious and if he had any suicidal ideation/intention.  Allison Bowman said that he did not, said that he "knew they wouldn't do it" and that we only "go in God's time."  Chaplain utilized active and reflective listening as Allison Bowman engaged in review of loss of mother and lack of support, particularly as his ex-partner is in a relationship with another person.  He shared thoughts around his theology of suffering.  He also identified financial concerns and chaplain encouraged him to speak with patient nurse and [facility].  Allison Bowman expressed readiness to return to patient room; chaplain escorted him, met patient's Hospice chaplain who spoke of resources available for patient's cremation.  On-call chaplain also engaged patient's nurse.  Chaplain offered a calming and non-anxious presence as Allison Bowman's ex arrived to offer him support, then walked them out after getting his contact information which chaplain then gave to patient's nurse.

## 2018-01-13 NOTE — Progress Notes (Deleted)
The patient has labored breathing, is on oxygen 4 L.  She is noncommunicative at this time. Vital signs reviewed.  Still tachycardia.  Physical examinations done.  Bilateral lung sounds crackles.  This is a 83-year-old female admitted for sepsis. 1.  Sepsis: The patient meets criteria via tachycardia and tachypnea as well as leukocytosis.  She is hemodynamically stable.  Obtain blood cultures and sputum sample if possible.  Continue broad-spectrum antibiotics. 2.  Acute respiratory failure with hypoxia due to aspiration pneumonia:  Continue oxygen by nasal counter, continue albuterol.  The patient was treated with clindamycin and azithromycin.  Changed to Zosyn IV. 3.  Hypertension: Continue lisinopril 4.  Hypokalemia: Hold Lasix, given potassium supplement.  Follow-up BMP. Hypomagnesemia.  Give IV magnesium and follow-up level. 5.  Femur fracture: Pain control, not a surgical candidate per Dr. Bowers.  I discussed with RN and hospice staff and palliative care staff. Time spent about 32 minutes. 

## 2018-01-13 NOTE — Progress Notes (Signed)
Called into room by hospice RN stating patient was not breathing well and color did not look good. This RN at bedside to assess. Patient non verbal, not responding to anything. Agonal breathing. Dr. Imogene Burnhen notified. He came to bedside to assess change in patient from our prior assessment. Hospice RN has contacted son to return to hospital. She will remain at bedside with patient. Patient repositioned. IV fluids stopped. Patient still on oxygen. Does not appear to be in any distress. Breathing is slow but regular.

## 2018-01-13 NOTE — Progress Notes (Signed)
Notified by hospice RN of patient death. Notified Dr. Imogene Burnhen to come to bedside to pronounce.

## 2018-01-13 NOTE — Progress Notes (Signed)
Called in room by son due to patient coughing. Patient son had given her a sip of water. Patient was coughing. Son instructed that patient was not able to have anything by mouth until re-evaluated by speech therapy. Son states patient told him she was thirsty. Patient repositioned in bed. Patient able to sit up independently while I repositioned and placed pillows behind her back. Patient able to answer questions from son and this RN. MD notified. Patient breathing normal after coughing episode. Left room with son at bedside visiting.

## 2018-01-13 DEATH — deceased

## 2018-03-28 ENCOUNTER — Ambulatory Visit: Payer: Medicare Other | Admitting: Unknown Physician Specialty

## 2018-09-07 IMAGING — CR DG HIP (WITH OR WITHOUT PELVIS) 2-3V*R*
3 series · 3 of 3 positions shown · non-contrast
Comparison: Right femur x-rays from same day.

CLINICAL DATA: Right hip fracture.

EXAM:
DG HIP (WITH OR WITHOUT PELVIS) 2-3V RIGHT

[pelvis ap]
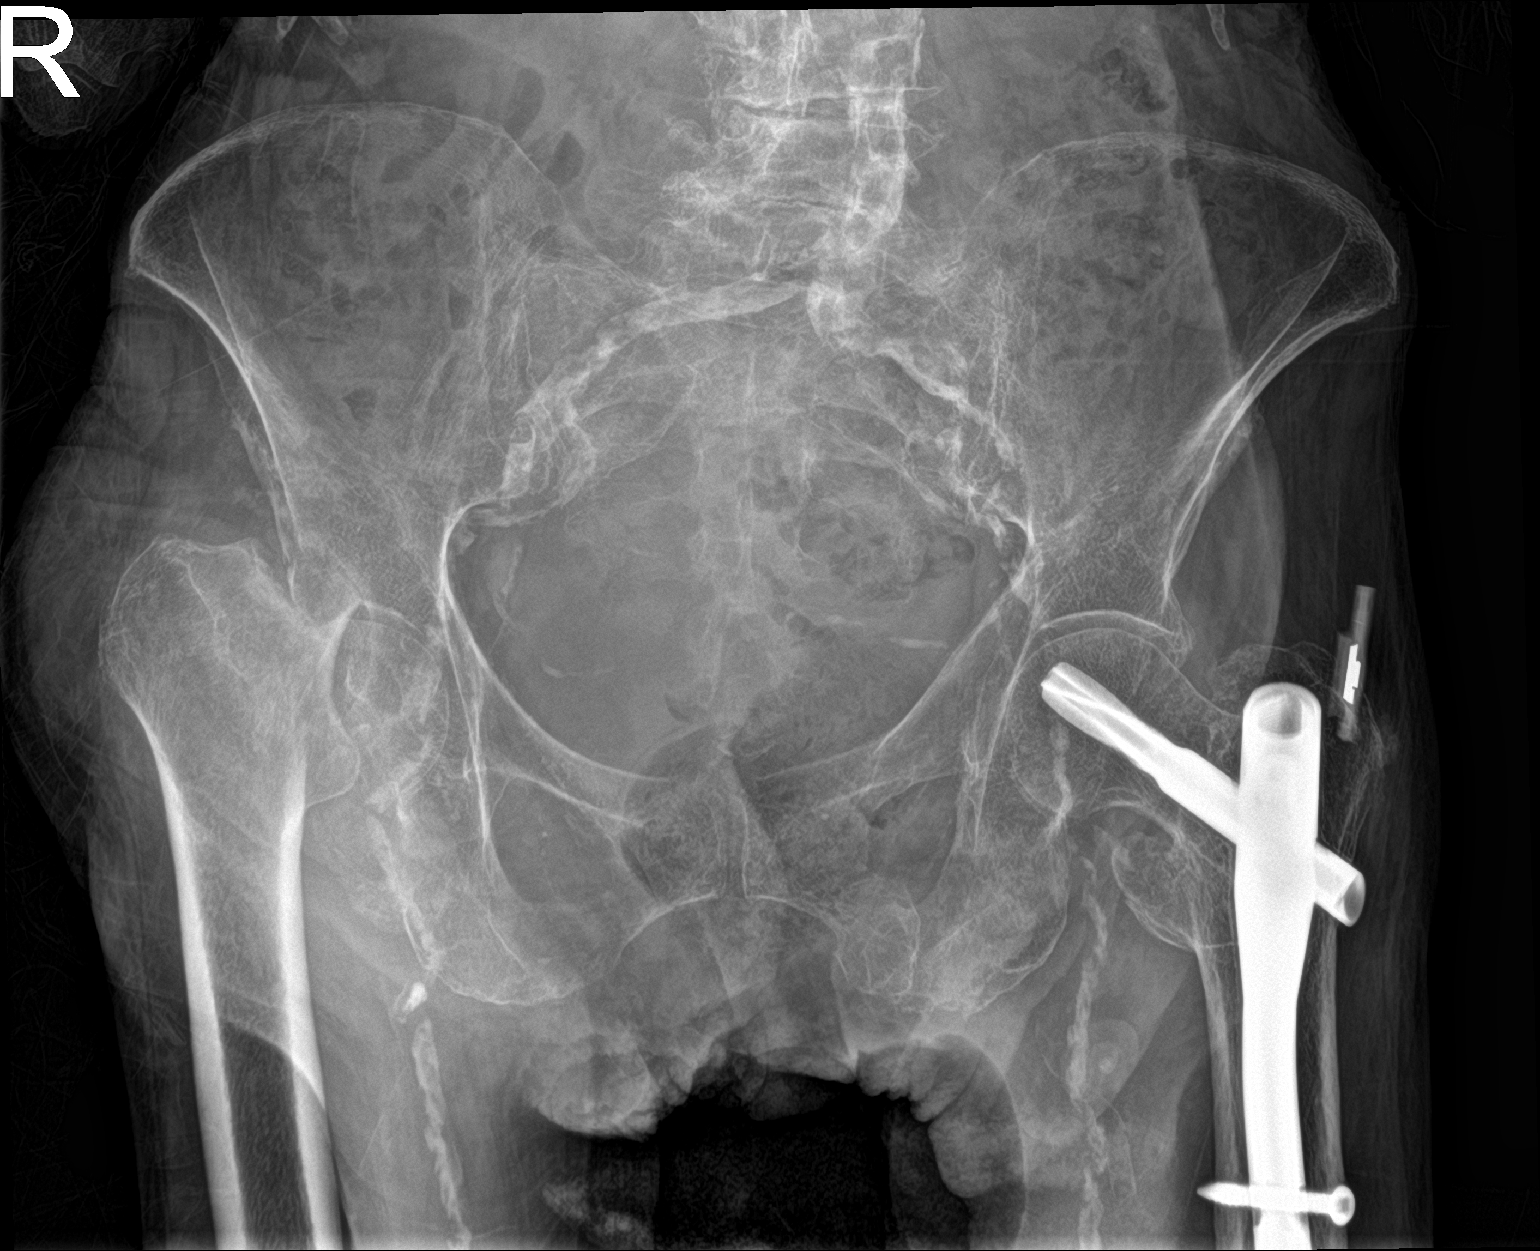

[hip ap]
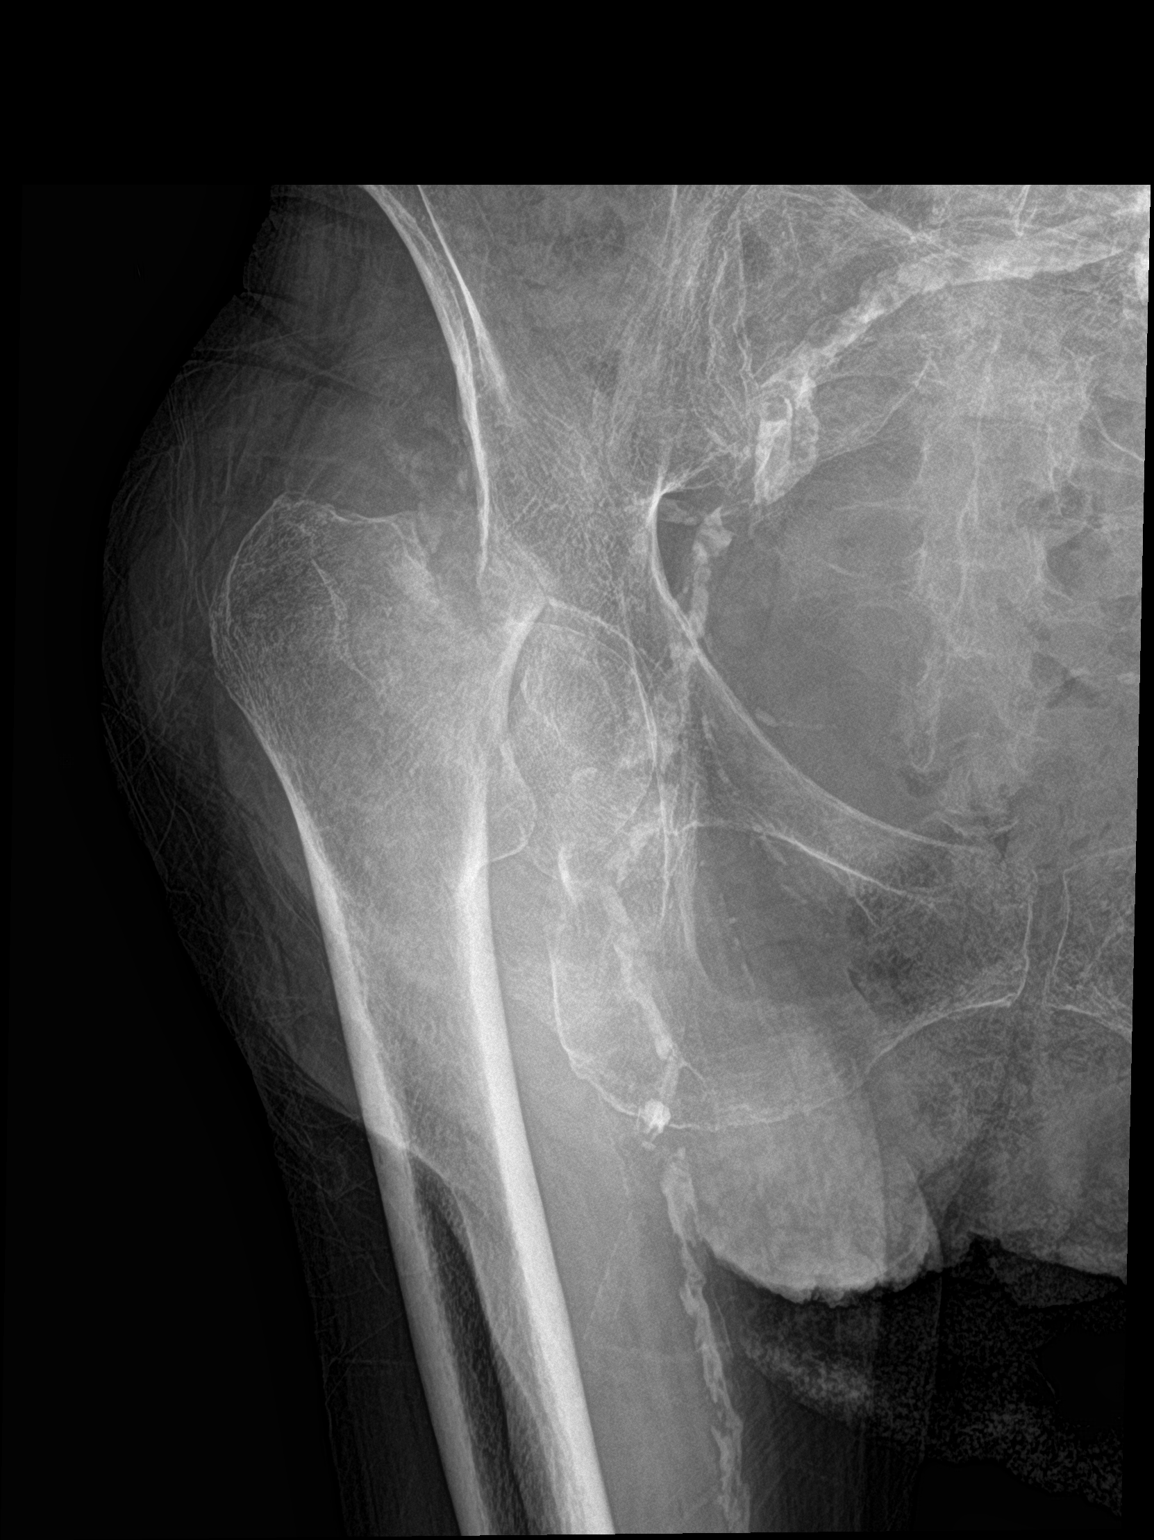

[hip lat]
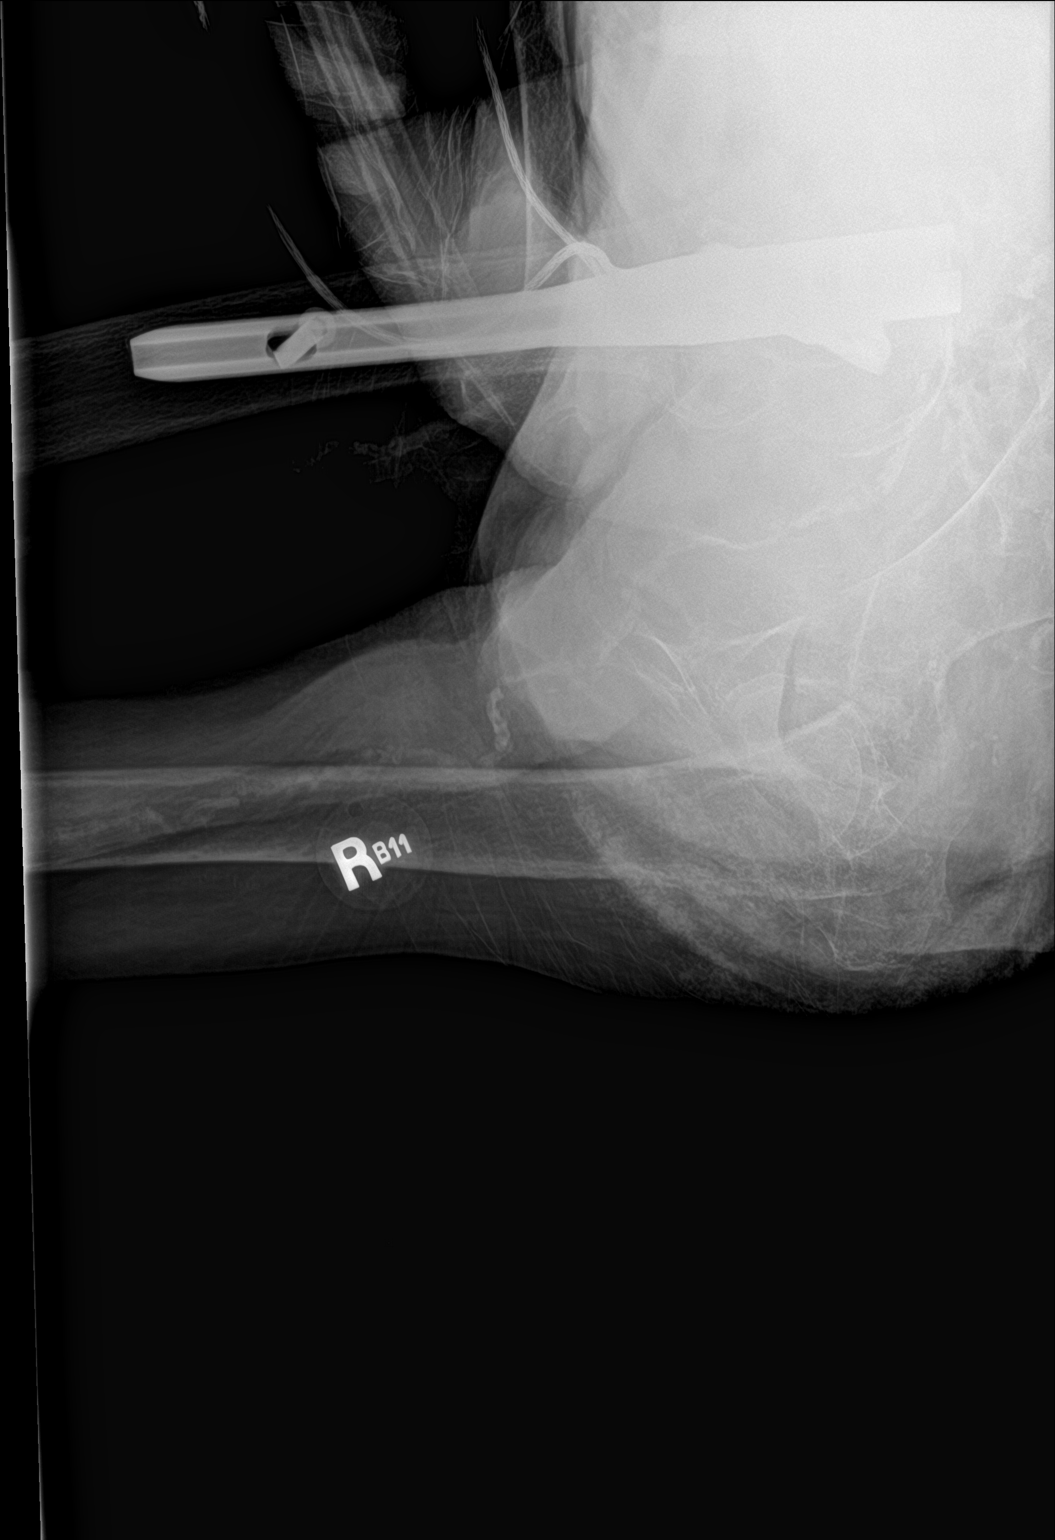

[3 of 3 positions shown; findings below may reference images not displayed]

FINDINGS: Again seen is a acute right femoral neck fracture with significant
superior displacement. The femoral head remains well seated in the
acetabulum. Prior left femur Gamma nail. Old left superior and
inferior pubic rami fractures. Severe osteopenia. Extensive vascular
calcifications.
IMPRESSION: Superiorly displaced right femoral neck fracture.

## 2018-09-07 IMAGING — CR DG FEMUR 2+V*R*
4 series · 4 of 4 positions shown · non-contrast
Comparison: Right hip radiograph dated 09/07/2017

CLINICAL DATA: 83-year-old female with right leg pain.

EXAM:
RIGHT FEMUR 2 VIEWS

[femur ap (1 of 2)]
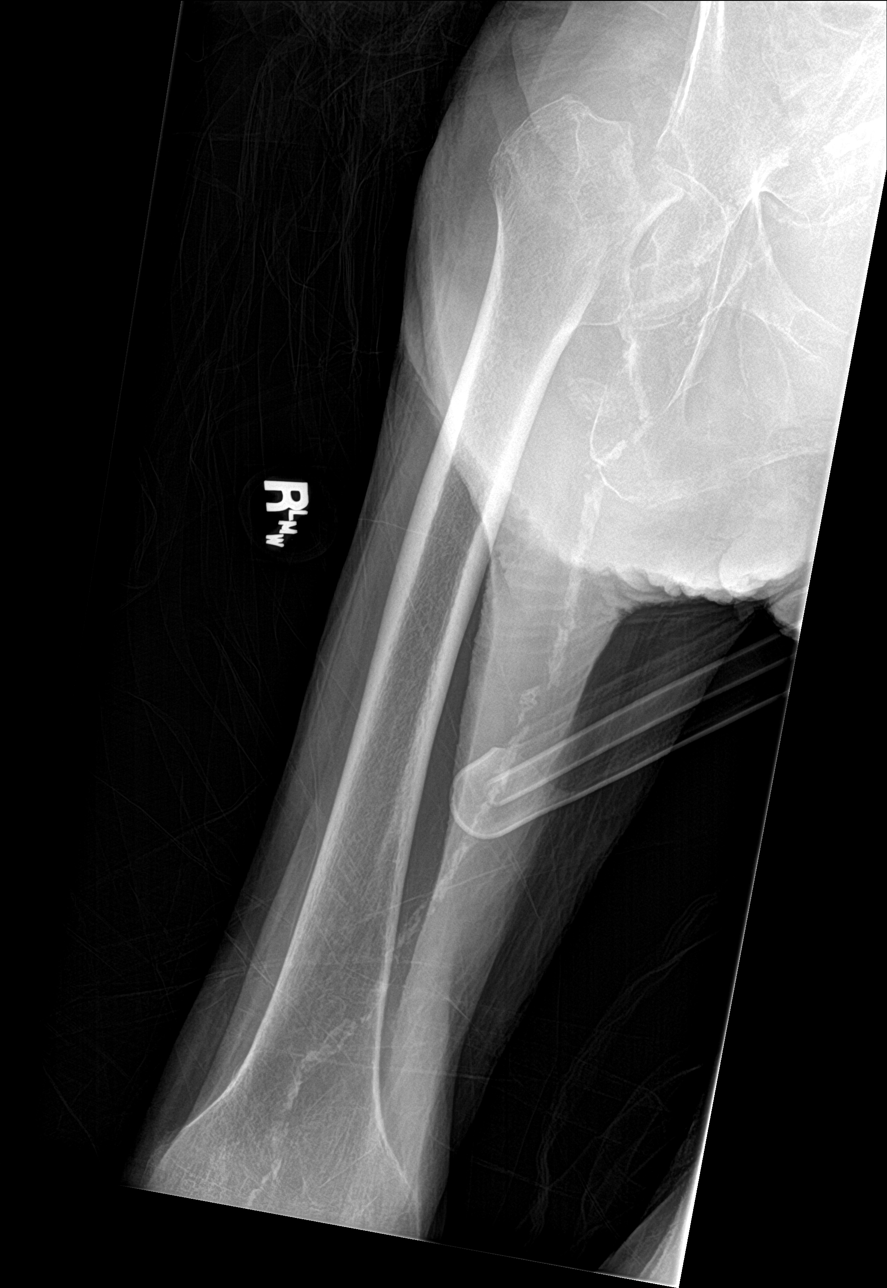

[femur ap (2 of 2)]
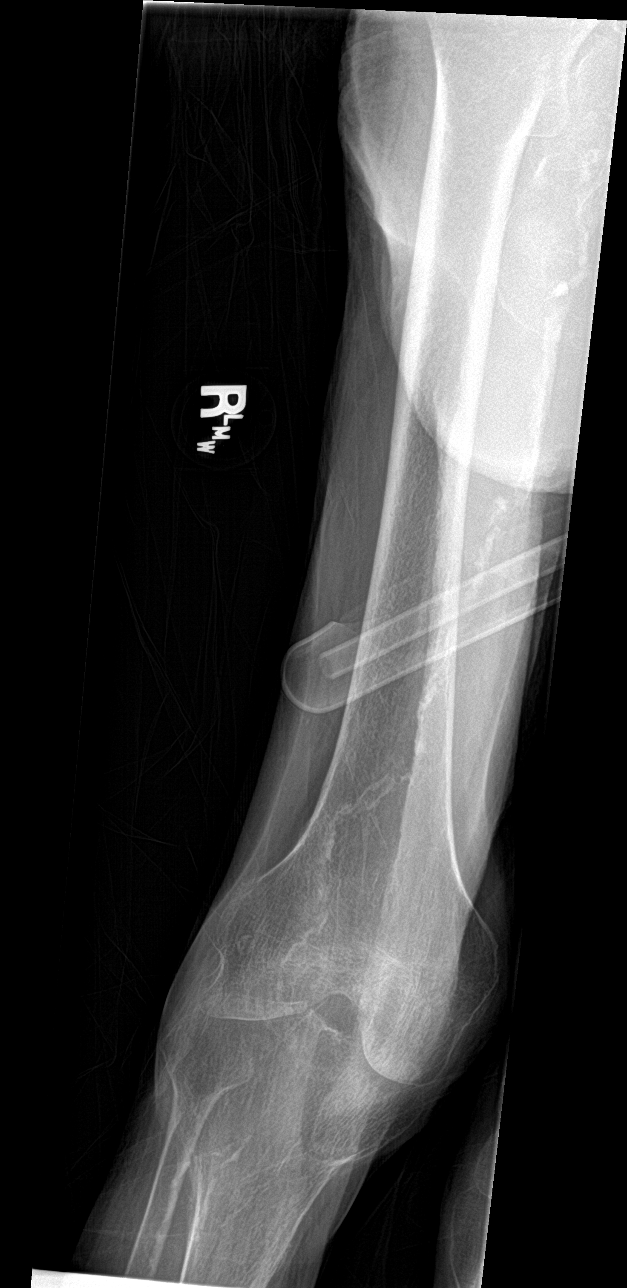

[femur lat (1 of 2)]
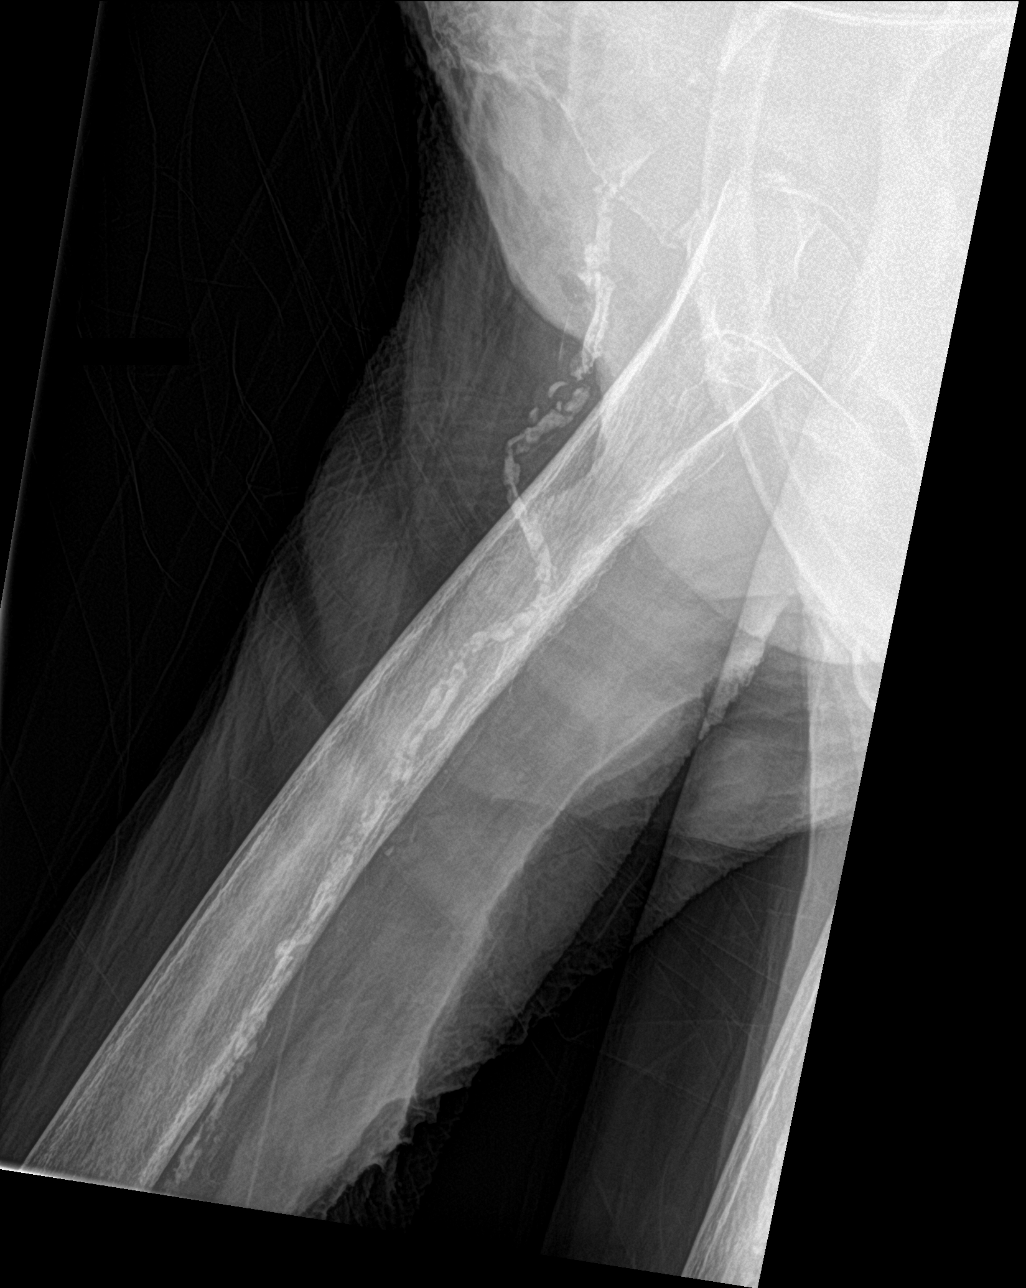

[femur lat (2 of 2)]
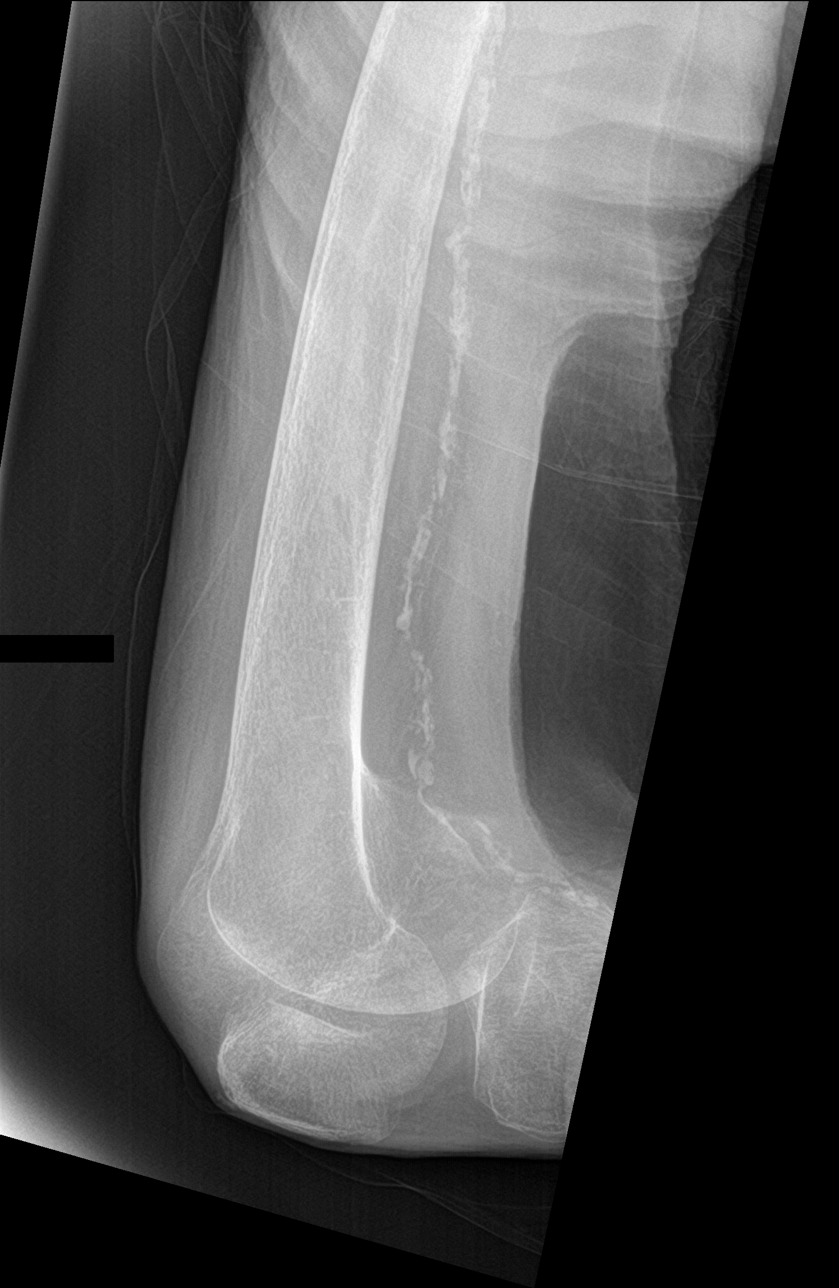

[4 of 4 positions shown; findings below may reference images not displayed]

FINDINGS: There is a displaced fracture of the right femoral neck with
proximal migration of the femoral shaft. No dislocation. The bones
are osteopenic. Vascular calcifications noted. The soft tissues are
grossly unremarkable.
IMPRESSION: Displaced right femoral neck fracture. Further evaluation with
dedicated right hip radiograph recommended.
